# Patient Record
Sex: Female | Born: 1995 | Race: Black or African American | Hispanic: No | Marital: Single | Smoking: Never smoker
Health system: Southern US, Community
[De-identification: ages and names within clinical notes are randomized; demographics above are authoritative.]

## PROBLEM LIST (undated history)

## (undated) DIAGNOSIS — D649 Anemia, unspecified: Secondary | ICD-10-CM

## (undated) DIAGNOSIS — F32A Depression, unspecified: Secondary | ICD-10-CM

## (undated) DIAGNOSIS — J45909 Unspecified asthma, uncomplicated: Secondary | ICD-10-CM

## (undated) DIAGNOSIS — F419 Anxiety disorder, unspecified: Secondary | ICD-10-CM

## (undated) HISTORY — DX: Depression, unspecified: F32.A

## (undated) HISTORY — DX: Anxiety disorder, unspecified: F41.9

---

## 2020-06-04 ENCOUNTER — Encounter (HOSPITAL_COMMUNITY): Payer: Self-pay | Admitting: Emergency Medicine

## 2020-06-04 ENCOUNTER — Other Ambulatory Visit: Payer: Self-pay

## 2020-06-04 ENCOUNTER — Observation Stay (HOSPITAL_COMMUNITY)
Admission: EM | Admit: 2020-06-04 | Discharge: 2020-06-05 | Disposition: A | Discharge status: Home / Self Care | Payer: Self-pay | Attending: Internal Medicine | Admitting: Internal Medicine

## 2020-06-04 ENCOUNTER — Emergency Department (HOSPITAL_COMMUNITY): Payer: Self-pay

## 2020-06-04 DIAGNOSIS — N760 Acute vaginitis: Secondary | ICD-10-CM | POA: Insufficient documentation

## 2020-06-04 DIAGNOSIS — D649 Anemia, unspecified: Principal | ICD-10-CM | POA: Diagnosis present

## 2020-06-04 DIAGNOSIS — Z20822 Contact with and (suspected) exposure to covid-19: Secondary | ICD-10-CM | POA: Insufficient documentation

## 2020-06-04 DIAGNOSIS — N920 Excessive and frequent menstruation with regular cycle: Secondary | ICD-10-CM | POA: Insufficient documentation

## 2020-06-04 DIAGNOSIS — J45909 Unspecified asthma, uncomplicated: Secondary | ICD-10-CM | POA: Insufficient documentation

## 2020-06-04 DIAGNOSIS — Z8616 Personal history of COVID-19: Secondary | ICD-10-CM | POA: Insufficient documentation

## 2020-06-04 DIAGNOSIS — R109 Unspecified abdominal pain: Secondary | ICD-10-CM | POA: Insufficient documentation

## 2020-06-04 HISTORY — DX: Anemia, unspecified: D64.9

## 2020-06-04 HISTORY — DX: Unspecified asthma, uncomplicated: J45.909

## 2020-06-04 LAB — URINALYSIS, ROUTINE W REFLEX MICROSCOPIC
Bilirubin Urine: NEGATIVE
Glucose, UA: NEGATIVE mg/dL
Ketones, ur: 20 mg/dL — AB
Nitrite: NEGATIVE
Protein, ur: 30 mg/dL — AB
RBC / HPF: >50 RBC/hpf — ABNORMAL HIGH (ref 0–5)
Specific Gravity, Urine: 1.021 (ref 1.005–1.030)
pH: 6 (ref 5.0–8.0)

## 2020-06-04 LAB — COMPREHENSIVE METABOLIC PANEL
ALT: 12 U/L (ref 0–44)
AST: 20 U/L (ref 15–41)
Albumin: 4.7 g/dL (ref 3.5–5.0)
Alkaline Phosphatase: 40 U/L (ref 38–126)
Anion gap: 8 (ref 5–15)
BUN: 6 mg/dL (ref 6–20)
CO2: 22 mmol/L (ref 22–32)
Calcium: 9 mg/dL (ref 8.9–10.3)
Chloride: 107 mmol/L (ref 98–111)
Creatinine, Ser: 0.64 mg/dL (ref 0.44–1.00)
GFR, Estimated: >60 mL/min (ref >60)
Glucose, Bld: 97 mg/dL (ref 70–99)
Potassium: 3.3 mmol/L — ABNORMAL LOW (ref 3.5–5.1)
Sodium: 137 mmol/L (ref 135–145)
Total Bilirubin: 0.8 mg/dL (ref 0.3–1.2)
Total Protein: 7.6 g/dL (ref 6.5–8.1)

## 2020-06-04 LAB — CBC
HCT: 20.5 % — ABNORMAL LOW (ref 36.0–46.0)
Hemoglobin: 4.9 g/dL — CL (ref 12.0–15.0)
MCH: 14.6 pg — ABNORMAL LOW (ref 26.0–34.0)
MCHC: 23.9 g/dL — ABNORMAL LOW (ref 30.0–36.0)
MCV: 61 fL — ABNORMAL LOW (ref 80.0–100.0)
Platelets: 170 10*3/uL (ref 150–400)
RBC: 3.36 MIL/uL — ABNORMAL LOW (ref 3.87–5.11)
RDW: 23.6 % — ABNORMAL HIGH (ref 11.5–15.5)
WBC: 7.6 10*3/uL (ref 4.0–10.5)
nRBC: 0 % (ref 0.0–0.2)

## 2020-06-04 LAB — RESP PANEL BY RT-PCR (FLU A&B, COVID) ARPGX2
Influenza A by PCR: NEGATIVE
Influenza B by PCR: NEGATIVE
SARS Coronavirus 2 by RT PCR: NEGATIVE

## 2020-06-04 LAB — I-STAT BETA HCG BLOOD, ED (MC, WL, AP ONLY): I-stat hCG, quantitative: <5 m[IU]/mL (ref <5)

## 2020-06-04 LAB — HEMOGLOBIN AND HEMATOCRIT, BLOOD
HCT: 19.6 % — ABNORMAL LOW (ref 36.0–46.0)
Hemoglobin: 4.6 g/dL — CL (ref 12.0–15.0)

## 2020-06-04 LAB — LIPASE, BLOOD: Lipase: 31 U/L (ref 11–51)

## 2020-06-04 LAB — PREPARE RBC (CROSSMATCH)

## 2020-06-04 LAB — ABO/RH: ABO/RH(D): O POS

## 2020-06-04 MED ORDER — METRONIDAZOLE IN NACL 5-0.79 MG/ML-% IV SOLN
500.0000 mg | Freq: Three times a day (TID) | INTRAVENOUS | Status: DC
  Administered 2020-06-05 (×2): 500 mg via INTRAVENOUS
  Filled 2020-06-04 (×2): qty 100

## 2020-06-04 MED ORDER — ACETAMINOPHEN 650 MG RE SUPP: 650.0000 mg | Freq: Four times a day (QID) | RECTAL | Status: DC | PRN

## 2020-06-04 MED ORDER — ACETAMINOPHEN 325 MG PO TABS: 650.0000 mg | ORAL_TABLET | Freq: Four times a day (QID) | ORAL | Status: DC | PRN

## 2020-06-04 MED ORDER — IOHEXOL 300 MG/ML  SOLN
100.0000 mL | Freq: Once | INTRAMUSCULAR | Status: AC | PRN
  Administered 2020-06-04: 100 mL via INTRAVENOUS

## 2020-06-04 MED ORDER — MORPHINE SULFATE (PF) 4 MG/ML IV SOLN
4.0000 mg | Freq: Once | INTRAVENOUS | Status: AC
  Administered 2020-06-04: 4 mg via INTRAVENOUS
  Filled 2020-06-04: qty 1

## 2020-06-04 MED ORDER — SODIUM CHLORIDE 0.9 % IV BOLUS
500.0000 mL | Freq: Once | INTRAVENOUS | Status: AC
  Administered 2020-06-04: 500 mL via INTRAVENOUS

## 2020-06-04 MED ORDER — SODIUM CHLORIDE 0.9% IV SOLUTION: Freq: Once | INTRAVENOUS | Status: AC

## 2020-06-04 MED ORDER — ONDANSETRON HCL 4 MG/2ML IJ SOLN
4.0000 mg | Freq: Four times a day (QID) | INTRAMUSCULAR | Status: DC | PRN
  Administered 2020-06-05: 4 mg via INTRAVENOUS
  Filled 2020-06-04: qty 2

## 2020-06-04 MED ORDER — SODIUM CHLORIDE 0.9 % IV BOLUS
1000.0000 mL | Freq: Once | INTRAVENOUS | Status: AC
  Administered 2020-06-04: 1000 mL via INTRAVENOUS

## 2020-06-04 MED ORDER — MEGESTROL ACETATE 40 MG PO TABS
40.0000 mg | ORAL_TABLET | ORAL | Status: DC
  Filled 2020-06-04: qty 1

## 2020-06-04 NOTE — ED Provider Notes (Addendum)
[facility]-EMERGENCY DEPT Provider Note   CSN: [id number] Arrival date & time: 06/04/20  1947     History Chief Complaint  Patient presents with  . Emesis    Michele Castaneda is a 25 y.o. female reports is otherwise healthy no daily medication use.  Patient arrives today for evaluation abdominal pain nausea and vomiting.  Patient reports yesterday she began to have lower abdominal cramping this was initially mild constant nonradiating no clear aggravating alleviating factors and she started her period this is the normal time of month for her to start.  She reports her period was normal yesterday but today her cramping worsened.  She reports she has had continued cramping of her lower abdomen but now feels that she has developed upper abdominal pain as well this has been a severe cramping sensation constant no clear aggravating or alleviating factors she feels it is separate from her menstrual cycle pain.  Patient reports that when pain is most severe she had 2 episodes of nonbloody/nonbilious emesis today.  Denies fever/chills, chest pain/shortness of breath, cough, dysuria/hematuria, vaginal discharge, concern for STI or any additional concerns.  Of note patient reports she was seen by her OB/GYN earlier this week and was started on Flagyl for BV.  HPI     History reviewed. No pertinent past medical history.  Patient Active Problem List   Diagnosis Date Noted  . Symptomatic anemia 06/04/2020  . Menorrhagia 06/04/2020    History reviewed. No pertinent surgical history.   OB History   No obstetric history on file.     History reviewed. No pertinent family history.     Home Medications Prior to Admission medications   Not on File    Allergies    Patient has no known allergies.  Review of Systems   Review of Systems Ten systems are reviewed and are negative for acute change except as noted in the HPI  Physical Exam Updated Vital Signs BP 113/63    Pulse 60   Temp 98.5 F (36.9 C) (Oral)   Resp 19   LMP 06/03/2020 (Exact Date)   SpO2 92%   Physical Exam Constitutional:      General: She is not in acute distress.    Appearance: Normal appearance. She is well-developed. She is not ill-appearing or diaphoretic.  HENT:     Head: Normocephalic and atraumatic.  Eyes:     General: Vision grossly intact. Gaze aligned appropriately.     Pupils: Pupils are equal, round, and reactive to light.  Neck:     Trachea: Trachea and phonation normal.  Pulmonary:     Effort: Pulmonary effort is normal. No respiratory distress.  Abdominal:     General: There is no distension.     Palpations: Abdomen is soft.     Tenderness: There is generalized abdominal tenderness. There is no guarding or rebound.  Genitourinary:    Comments: Refused by patient. Musculoskeletal:        General: Normal range of motion.     Cervical back: Normal range of motion.  Skin:    General: Skin is warm and dry.  Neurological:     Mental Status: She is alert.     GCS: GCS eye subscore is 4. GCS verbal subscore is 5. GCS motor subscore is 6.     Comments: Speech is clear and goal oriented, follows commands Major Cranial nerves without deficit, no facial droop Moves extremities without ataxia, coordination intact  Psychiatric:  Behavior: Behavior normal.     ED Results / Procedures / Treatments   Labs (all labs ordered are listed, but only abnormal results are displayed) Labs Reviewed  COMPREHENSIVE METABOLIC PANEL - Abnormal; Notable for the following components:      Result Value   Potassium 3.3 (*)    All other components within normal limits  CBC - Abnormal; Notable for the following components:   RBC 3.36 (*)    Hemoglobin 4.9 (*)    HCT 20.5 (*)    MCV 61.0 (*)    MCH 14.6 (*)    MCHC 23.9 (*)    RDW 23.6 (*)    All other components within normal limits  URINALYSIS, ROUTINE W REFLEX MICROSCOPIC - Abnormal; Notable for the following  components:   Color, Urine AMBER (*)    Hgb urine dipstick MODERATE (*)    Ketones, ur 20 (*)    Protein, ur 30 (*)    Leukocytes,Ua SMALL (*)    RBC / HPF >50 (*)    Bacteria, UA RARE (*)    All other components within normal limits  HEMOGLOBIN AND HEMATOCRIT, BLOOD - Abnormal; Notable for the following components:   Hemoglobin 4.6 (*)    HCT 19.6 (*)    All other components within normal limits  RESP PANEL BY RT-PCR (FLU A&B, COVID) ARPGX2  LIPASE, BLOOD  I-STAT BETA HCG BLOOD, ED ([facility], [facility], [facility] ONLY)  TYPE AND SCREEN  PREPARE RBC (CROSSMATCH)  ABO/RH    EKG None  Radiology CT ABDOMEN PELVIS W CONTRAST  Result Date: 06/04/2020 CLINICAL DATA:  Vomiting and weakness for 2 days EXAM: CT ABDOMEN AND PELVIS WITH CONTRAST TECHNIQUE: Multidetector CT imaging of the abdomen and pelvis was performed using the standard protocol following bolus administration of intravenous contrast. CONTRAST:  100mL OMNIPAQUE IOHEXOL 300 MG/ML  SOLN COMPARISON:  None. FINDINGS: Lower chest: No acute abnormality. Hepatobiliary: No focal liver abnormality is seen. No gallstones, gallbladder wall thickening, or biliary dilatation. Pancreas: Unremarkable. No pancreatic ductal dilatation or surrounding inflammatory changes. Spleen: Normal in size without focal abnormality. Adrenals/Urinary Tract: Adrenal glands are within normal limits. Kidneys demonstrate a normal enhancement pattern bilaterally. No renal calculi or obstructive changes are seen. Bladder is partially distended and within normal limits. Stomach/Bowel: The appendix is within normal limits. No inflammatory changes are seen. No obstructive or inflammatory changes of the colon or small bowel are noted. The stomach is decompressed. Vascular/Lymphatic: No significant vascular findings are present. No enlarged abdominal or pelvic lymph nodes. Reproductive: Uterus is within normal limits. Fluid is noted within the endometrial canal likely related patient's  menstrual status. Some simple cysts are noted within the right ovary. The largest of these measures 2.6 cm in greatest dimension. Other: Minimal free fluid is noted likely physiologic in nature. Musculoskeletal: No acute or significant osseous findings. IMPRESSION: No renal calculi or obstructive changes. Simple appearing cyst within the right ovary measuring 2.6 cm. No follow-up imaging recommended. Note: This recommendation does not apply to premenarchal patients and to those with increased risk (genetic, family history, elevated tumor markers or other high-risk factors) of ovarian cancer. Reference: JACR 2020 Feb; 17(2):248-254 Electronically Signed   By: Alcide CleverMark  Lukens M.D.   On: 06/04/2020 22:54    Procedures .Critical Care Performed by: Bill SalinasMorelli, Arneshia Ade A, PA-C Authorized by: Bill SalinasMorelli, Tonjua Rossetti A, PA-C   Critical care provider statement:    Critical care time (minutes):  35   Critical care was necessary to treat or prevent imminent  or life-threatening deterioration of the following conditions: Anemia.   Critical care was time spent personally by me on the following activities:  Discussions with consultants, evaluation of patient's response to treatment, examination of patient, ordering and performing treatments and interventions, ordering and review of laboratory studies, ordering and review of radiographic studies, pulse oximetry, re-evaluation of patient's condition, obtaining history from patient or surrogate, review of old charts and development of treatment plan with patient or surrogate     Medications Ordered in ED Medications  megestrol (MEGACE) tablet 40 mg (has no administration in time range)  sodium chloride 0.9 % bolus 1,000 mL (0 mLs Intravenous Stopped 06/04/20 2139)  morphine 4 MG/ML injection 4 mg (4 mg Intravenous Given 06/04/20 2201)  sodium chloride 0.9 % bolus 500 mL (0 mLs Intravenous Stopped 06/04/20 2251)  0.9 %  sodium chloride infusion (Manually program via Guardrails IV  Fluids) ( Intravenous New Bag/Given 06/04/20 2258)  iohexol (OMNIPAQUE) 300 MG/ML solution 100 mL (100 mLs Intravenous Contrast Given 06/04/20 2230)    ED Course  I have reviewed the triage vital signs and the nursing notes.  Pertinent labs & imaging results that were available during my care of the patient were reviewed by me and considered in my medical decision making (see chart for details).  Clinical Course as of 06/04/20 2331  Wynelle Link Jun 04, 2020  2305 Pelvic US to look for fibroids [BM]  2317 Megace 40mg  bid 7 day [BM]  2317 Dr. [BM]  2320 Check for von wilebrands? [BM]    Clinical Course User Index [BM] 2321   MDM Rules/Calculators/A&P                         Additional history obtained from: 1. Nursing notes from this visit. 2. No prior medical records available. ------------------- 25 year old female presented for abdominal pain nausea and vomiting.  She started her menstrual cycle yesterday normal amount of vaginal bleeding and lower pelvic cramping yesterday.  Pain changed today more generalized more severe and associated nausea and vomiting which is abnormal for her.  She is diffusely tender on exam.  Abdominal pain labs ordered in triage.  Patient denies any concern for STD and deferred pelvic examination.  Plan of care is to obtain CT abdomen pelvis for further evaluation.  Possibly in the patient's pain today may be due to other intra-abdominal pathology rather than her menstrual cycle such as appendicitis.  Shared decision making made with patient and risks versus benefits of CT imaging were reviewed and patient elects to proceed with CT imaging.  Patient's mother at bedside agrees with plan of care. ----------------- Informed by nursing staff patient's hemoglobin is 4.9.  I reevaluated the patient she reports that she has a history of anemia and does not take her iron supplementations but has not had her hemoglobin rechecked in several years and does not  know what her baseline is.  I have no records on file to see what patient's baseline hemoglobin is.  Patient reports that she has been feeling weak over the past few days denies any chest pain or shortness of breath.  Patient will need blood transfusion for severe anemia, risk versus benefits of blood transfusion were discussed with patient and her mother and they elect to proceed with 2 units RBC transfusion.  Patient reports that she is having her normal amount of vaginal bleeding she normally goes through around 6-7 pads a day and her cycle  recently last 5 days.  Her last menstrual cycle was 1 month ago.  She denies any hematemesis, melena, hematochezia or other possible sources of bleeding - I ordered, reviewed and interpreted labs which include: Pregnancy test negative. CMP shows no emergent electrolyte derangement, AKI, LFT elevations or gap. CBC shows hemoglobin 4.9, no leukocytosis. Lipase within normal limits. Urinalysis shows hemoglobin, WBCs, rare bacteria, nitrate negative suspect these are due to contaminated catch and patient's menses.  CTAP  IMPRESSION:  No renal calculi or obstructive changes.    Simple appearing cyst within the right ovary measuring 2.6 cm. No  follow-up imaging recommended. Note: This recommendation does not  apply to premenarchal patients and to those with increased risk  (genetic, family history, elevated tumor markers or other high-risk  factors) of ovarian cancer. Reference: JACR 2020 Feb; 17(2):248-254  -  11:10 PM: Consult with Dr. Toniann Fail, asked that ER consult OB/GYN prior to admission. - 11:17 PM: Consult with OB/GYN Dr. Donavan Foil; recommends Megace 40 mg twice daily x7 days.  Recommends pelvic ultrasound to look for fibroids.  Recommends checking for von Willebrand's due to severe anemia.  Recommends outpatient OB/GYN follow-up after admission. - 11:25 PM: Consult with Dr. Toniann Fail, patient accepted to hospitalist service.  Patient reassessed she  is resting comfortably no acute distress mother at bedside vital signs are stable.  They state understanding of care plan and are agreeable for admission.  Case was discussed with Dr. Effie Shy during this visit.  Note: Portions of this report may have been transcribed using voice recognition software. Every effort was made to ensure accuracy; however, inadvertent computerized transcription errors may still be present. Final Clinical Impression(s) / ED Diagnoses Final diagnoses:  Symptomatic anemia  Abdominal pain, unspecified abdominal location    Rx / DC Orders ED Discharge Orders    None       Elizabeth Palau 06/04/20 2332    Mancel Bale, MD 06/05/20 1605    Bill Salinas, PA-C 06/07/20 1016    Mancel Bale, MD 06/12/20 [phone]

## 2020-06-04 NOTE — H&P (Signed)
History and Physical    Michele Castaneda EPP:[id number] DOB: 02-Aug-1995 DOA: 06/04/2020  PCP: Pcp, No  Patient coming from: Home.  Chief Complaint: Abdominal pain.  HPI: Michele Castaneda is a 25 y.o. female with history of anemia from menorrhagia presents to the ER with complaints of abdominal pain since this morning.  Pain is diffuse all over the abdomen.  Had 2 or 3 episodes of vomiting which was nonbloody.  Patient has known history of anemia from menorrhagia follows up with OB/GYN at [facility] [place].  Patient states she had Covid infection last month and only had mild symptoms.  Patient also was recently started about 5 days ago on Flagyl for bacterial vaginosis by patient's OB/GYN.  ED Course: In the ER CT abdomen pelvis was done with contrast which are largely unremarkable.  Patient's lab work showed hemoglobin of 4.9.  Patient just had menstrual cycle started yesterday.  ER physician discussed with on-call OB/GYN who advised starting patient on Megace 40 mg p.o. twice daily.  Patient also has been started on 2 units of PRBC transfusion for severe anemia.  Admitted for further observation.  Review of Systems: As per HPI, rest all negative.   Past Medical History:  Diagnosis Date  . Anemia   . Asthma     History reviewed. No pertinent surgical history.   reports that she has never smoked. She has never used smokeless tobacco. No history on file for alcohol use and drug use.  No Known Allergies  Family History  Problem Relation Age of Onset  . Colon cancer Neg Hx     Prior to Admission medications   Not on File    Physical Exam: Constitutional: Moderately built and nourished. Vitals:   06/04/20 2015 06/04/20 2100 06/04/20 2200 06/04/20 2300  BP: 109/72 101/61 111/67 113/63  Pulse: 100 95 71 60  Resp: 15 16 15 19   Temp:      TempSrc:      SpO2: 100% 100% 100% 92%   Eyes: Anicteric mild pallor. ENMT: No discharge from the ears eyes nose or mouth. Neck:  No mass felt.  No neck rigidity. Respiratory: No rhonchi or crepitations. Cardiovascular: S1-S2 heard. Abdomen: Soft nontender bowel sounds present. Musculoskeletal: No edema. Skin: No rash. Neurologic: Alert awake oriented to time place and person.  Moves all extremities. Psychiatric: Appears normal.  Normal affect.   Labs on Admission: I have personally reviewed following labs and imaging studies  CBC: Recent Labs  Lab 06/04/20 2018 06/04/20 2151  WBC 7.6  --   HGB 4.9* 4.6*  HCT 20.5* 19.6*  MCV 61.0*  --   PLT 170  --    Basic Metabolic Panel: Recent Labs  Lab 06/04/20 2018  NA 137  K 3.3*  CL 107  CO2 22  GLUCOSE 97  BUN 6  CREATININE 0.64  CALCIUM 9.0   GFR: CrCl cannot be calculated (Unknown ideal weight.). Liver Function Tests: Recent Labs  Lab 06/04/20 2018  AST 20  ALT 12  ALKPHOS 40  BILITOT 0.8  PROT 7.6  ALBUMIN 4.7   Recent Labs  Lab 06/04/20 2018  LIPASE 31   No results for input(s): AMMONIA in the last 168 hours. Coagulation Profile: No results for input(s): INR, PROTIME in the last 168 hours. Cardiac Enzymes: No results for input(s): CKTOTAL, CKMB, CKMBINDEX, TROPONINI in the last 168 hours. BNP (last 3 results) No results for input(s): PROBNP in the last 8760 hours. HbA1C: No results for input(s):  HGBA1C in the last 72 hours. CBG: No results for input(s): GLUCAP in the last 168 hours. Lipid Profile: No results for input(s): CHOL, HDL, LDLCALC, TRIG, CHOLHDL, LDLDIRECT in the last 72 hours. Thyroid Function Tests: No results for input(s): TSH, T4TOTAL, FREET4, T3FREE, THYROIDAB in the last 72 hours. Anemia Panel: No results for input(s): VITAMINB12, FOLATE, FERRITIN, TIBC, IRON, RETICCTPCT in the last 72 hours. Urine analysis:    Component Value Date/Time   COLORURINE AMBER (A) 06/04/2020 2000   APPEARANCEUR CLEAR 06/04/2020 2000   LABSPEC 1.021 06/04/2020 2000   PHURINE 6.0 06/04/2020 2000   GLUCOSEU NEGATIVE 06/04/2020  2000   HGBUR MODERATE (A) 06/04/2020 2000   BILIRUBINUR NEGATIVE 06/04/2020 2000   KETONESUR 20 (A) 06/04/2020 2000   PROTEINUR 30 (A) 06/04/2020 2000   NITRITE NEGATIVE 06/04/2020 2000   LEUKOCYTESUR SMALL (A) 06/04/2020 2000   Sepsis Labs: @LABRCNTIP (procalcitonin:4,lacticidven:4) )No results found for this or any previous visit (from the past 240 hour(s)).   Radiological Exams on Admission: CT ABDOMEN PELVIS W CONTRAST  Result Date: 06/04/2020 CLINICAL DATA:  Vomiting and weakness for 2 days EXAM: CT ABDOMEN AND PELVIS WITH CONTRAST TECHNIQUE: Multidetector CT imaging of the abdomen and pelvis was performed using the standard protocol following bolus administration of intravenous contrast. CONTRAST:  08/04/2020 OMNIPAQUE IOHEXOL 300 MG/ML  SOLN COMPARISON:  None. FINDINGS: Lower chest: No acute abnormality. Hepatobiliary: No focal liver abnormality is seen. No gallstones, gallbladder wall thickening, or biliary dilatation. Pancreas: Unremarkable. No pancreatic ductal dilatation or surrounding inflammatory changes. Spleen: Normal in size without focal abnormality. Adrenals/Urinary Tract: Adrenal glands are within normal limits. Kidneys demonstrate a normal enhancement pattern bilaterally. No renal calculi or obstructive changes are seen. Bladder is partially distended and within normal limits. Stomach/Bowel: The appendix is within normal limits. No inflammatory changes are seen. No obstructive or inflammatory changes of the colon or small bowel are noted. The stomach is decompressed. Vascular/Lymphatic: No significant vascular findings are present. No enlarged abdominal or pelvic lymph nodes. Reproductive: Uterus is within normal limits. Fluid is noted within the endometrial canal likely related patient's menstrual status. Some simple cysts are noted within the right ovary. The largest of these measures 2.6 cm in greatest dimension. Other: Minimal free fluid is noted likely physiologic in nature.  Musculoskeletal: No acute or significant osseous findings. IMPRESSION: No renal calculi or obstructive changes. Simple appearing cyst within the right ovary measuring 2.6 cm. No follow-up imaging recommended. Note: This recommendation does not apply to premenarchal patients and to those with increased risk (genetic, family history, elevated tumor markers or other high-risk factors) of ovarian cancer. Reference: JACR 2020 Feb; 17(2):248-254 Electronically Signed   By: [birth year]1972 M.D.   On: 06/04/2020 22:54     Assessment/Plan Principal Problem:   Symptomatic anemia Active Problems:   Menorrhagia    1. Severe anemia -likely from menorrhagia.  2 units of PRBC transfusion has been ordered patient will need to be on iron tablets. 2. Menorrhagia -ER physician discussed with on-call OB/GYN who advised patient to be started on Megace 40 mg p.o. twice daily for 7 days get pelvic ultrasound and follow-up with patient's OB/GYN at [facility] in [place] which patient follows up.  Also to check for any bleeding disorders.  Patient refused pelvic ultrasound and had a transabdominal pelvic ultrasound. 3. Recently treated for bacterial vaginosis for which patient has taken 5 days of Flagyl 2 more days remaining.  Ordered IV. 4. Abdominal pain cause not clear.  CT abdomen  pelvis was unremarkable.  We will continue to monitor.  Follow transabdominal pelvic ultrasound. 5. Patient recently had Covid infection last month.   DVT prophylaxis: SCDs.  Avoiding anticoagulation due to severe anemia. Code Status: Full code. Family Communication: Patient's mother at the bedside. Disposition Plan: Home. Consults called: ER physician discussed with on-call OB/GYN. Admission status: Observation.   Eduard Clos MD [facility] Pager [phone].  If 7PM-7AM, please contact night-coverage [email or web address] Password [facility]  06/04/2020, 11:37 PM

## 2020-06-04 NOTE — ED Triage Notes (Signed)
Pt reports vomiting, weakness, and crampy abdominal pain for 2 days. Reports 2 episodes of vomiting today. Denies sick contacts. No fever.

## 2020-06-05 DIAGNOSIS — R109 Unspecified abdominal pain: Secondary | ICD-10-CM

## 2020-06-05 DIAGNOSIS — N920 Excessive and frequent menstruation with regular cycle: Secondary | ICD-10-CM

## 2020-06-05 DIAGNOSIS — D649 Anemia, unspecified: Secondary | ICD-10-CM

## 2020-06-05 LAB — CBC WITH DIFFERENTIAL/PLATELET
Abs Immature Granulocytes: 0.06 10*3/uL (ref 0.00–0.07)
Basophils Absolute: 0.1 10*3/uL (ref 0.0–0.1)
Basophils Relative: 1 %
Eosinophils Absolute: 0.1 10*3/uL (ref 0.0–0.5)
Eosinophils Relative: 1 %
HCT: 28.7 % — ABNORMAL LOW (ref 36.0–46.0)
Hemoglobin: 7.7 g/dL — ABNORMAL LOW (ref 12.0–15.0)
Immature Granulocytes: 1 %
Lymphocytes Relative: 24 %
Lymphs Abs: 1.8 10*3/uL (ref 0.7–4.0)
MCH: 18.8 pg — ABNORMAL LOW (ref 26.0–34.0)
MCHC: 26.8 g/dL — ABNORMAL LOW (ref 30.0–36.0)
MCV: 70 fL — ABNORMAL LOW (ref 80.0–100.0)
Monocytes Absolute: 0.7 10*3/uL (ref 0.1–1.0)
Monocytes Relative: 9 %
Neutro Abs: 4.9 10*3/uL (ref 1.7–7.7)
Neutrophils Relative %: 64 %
Platelets: 143 10*3/uL — ABNORMAL LOW (ref 150–400)
RBC: 4.1 MIL/uL (ref 3.87–5.11)
RDW: 27.6 % — ABNORMAL HIGH (ref 11.5–15.5)
WBC: 7.6 10*3/uL (ref 4.0–10.5)
nRBC: 0.4 % — ABNORMAL HIGH (ref 0.0–0.2)

## 2020-06-05 LAB — BASIC METABOLIC PANEL
Anion gap: 11 (ref 5–15)
BUN: <5 mg/dL — ABNORMAL LOW (ref 6–20)
CO2: 19 mmol/L — ABNORMAL LOW (ref 22–32)
Calcium: 8.4 mg/dL — ABNORMAL LOW (ref 8.9–10.3)
Chloride: 110 mmol/L (ref 98–111)
Creatinine, Ser: 0.53 mg/dL (ref 0.44–1.00)
GFR, Estimated: >60 mL/min (ref >60)
Glucose, Bld: 83 mg/dL (ref 70–99)
Potassium: 3.3 mmol/L — ABNORMAL LOW (ref 3.5–5.1)
Sodium: 140 mmol/L (ref 135–145)

## 2020-06-05 LAB — HEMOGLOBIN AND HEMATOCRIT, BLOOD
HCT: 27.9 % — ABNORMAL LOW (ref 36.0–46.0)
Hemoglobin: 7.8 g/dL — ABNORMAL LOW (ref 12.0–15.0)

## 2020-06-05 LAB — MAGNESIUM: Magnesium: 2 mg/dL (ref 1.7–2.4)

## 2020-06-05 LAB — HIV ANTIBODY (ROUTINE TESTING W REFLEX): HIV Screen 4th Generation wRfx: NONREACTIVE

## 2020-06-05 MED ORDER — MEGESTROL ACETATE 40 MG PO TABS
40.0000 mg | ORAL_TABLET | Freq: Two times a day (BID) | ORAL | Status: DC
  Administered 2020-06-05: 40 mg via ORAL
  Filled 2020-06-05 (×2): qty 1

## 2020-06-05 MED ORDER — SENNOSIDES-DOCUSATE SODIUM 8.6-50 MG PO TABS: 1.0000 | ORAL_TABLET | Freq: Two times a day (BID) | ORAL | Status: AC

## 2020-06-05 MED ORDER — SENNOSIDES-DOCUSATE SODIUM 8.6-50 MG PO TABS
1.0000 | ORAL_TABLET | Freq: Two times a day (BID) | ORAL | Status: DC
  Administered 2020-06-05: 1 via ORAL
  Filled 2020-06-05: qty 1

## 2020-06-05 MED ORDER — OXYCODONE-ACETAMINOPHEN 5-325 MG PO TABS: 1.0000 | ORAL_TABLET | ORAL | Status: DC | PRN

## 2020-06-05 MED ORDER — POTASSIUM CHLORIDE CRYS ER 20 MEQ PO TBCR
40.0000 meq | EXTENDED_RELEASE_TABLET | Freq: Once | ORAL | Status: AC
  Administered 2020-06-05: 40 meq via ORAL
  Filled 2020-06-05: qty 2

## 2020-06-05 MED ORDER — POLYSACCHARIDE IRON COMPLEX 150 MG PO CAPS
150.0000 mg | ORAL_CAPSULE | Freq: Every day | ORAL | Status: DC
  Administered 2020-06-05: 150 mg via ORAL
  Filled 2020-06-05: qty 1

## 2020-06-05 MED ORDER — POLYSACCHARIDE IRON COMPLEX 150 MG PO CAPS: 150.0000 mg | ORAL_CAPSULE | Freq: Every day | ORAL | 1 refills | Status: DC

## 2020-06-05 MED ORDER — MEGESTROL ACETATE 40 MG PO TABS: 40.0000 mg | ORAL_TABLET | Freq: Two times a day (BID) | ORAL | 0 refills | Status: AC

## 2020-06-05 MED ORDER — PROMETHAZINE HCL 25 MG/ML IJ SOLN
12.5000 mg | Freq: Once | INTRAMUSCULAR | Status: AC
  Administered 2020-06-05: 12.5 mg via INTRAVENOUS
  Filled 2020-06-05: qty 1

## 2020-06-05 NOTE — Discharge Summary (Signed)
Physician Discharge Summary  Michele Castaneda VQM:[id number] DOB: May 28, 1995 DOA: 06/04/2020  PCP: Pcp, No  Admit date: 06/04/2020 Discharge date: 06/05/2020  Time spent: 45 minutes  Recommendations for Outpatient Follow-up:  1. Follow-up with PCP repeat in 2 weeks.  On follow-up patient will need a CBC done to follow-up on H&H. 2. Follow-up with OB/GYN in 1 to 2 weeks for follow-up on menorrhagia.   Discharge Diagnoses:  Principal Problem:   Symptomatic anemia Active Problems:   Menorrhagia   Anemia   Discharge Condition: Stable and improved  Diet recommendation: Regular  Filed Weights   06/05/20 0031  Weight: 57.2 kg    History of present illness:  HPI per Dr. Nolon Castaneda is a 25 y.o. female with history of anemia from menorrhagia presents to the ER with complaints of abdominal pain since this morning.  Pain is diffuse all over the abdomen.  Had 2 or 3 episodes of vomiting which was nonbloody.  Patient has known history of anemia from menorrhagia follows up with OB/GYN at [facility] [place].  Patient stated she had Covid infection last month and only had mild symptoms.  Patient also was recently started about 5 days ago on Flagyl for bacterial vaginosis by patient's OB/GYN.  ED Course: In the ER CT abdomen pelvis was done with contrast which are largely unremarkable.  Patient's lab work showed hemoglobin of 4.9.  Patient just had menstrual cycle started yesterday.  ER physician discussed with on-call OB/GYN who advised starting patient on Megace 40 mg p.o. twice daily.  Patient also has been started on 2 units of PRBC transfusion for severe anemia.  Admitted for further observation.  Hospital Course:  1 severe symptomatic microcytic anemia likely secondary to menorrhagia Patient had presented with complaints of abdominal pain diffusely, 2-3 episodes of emesis which was nonbloody, recently started her menses.  Patient noted to have a known history of anemia  from menorrhagia follows up with OB/GYN.  On admission hemoglobin noted to be 4.9.  Patient transfused 2 units packed red blood cells with posttransfusion hemoglobin at 7.7.  Patient with clinical improvement.  Repeat H&H done was 7.8. Patient was discharged home in stable and improved condition on Nu-Iron 150 mg daily.  Outpatient follow-up with PCP and OB/GYN.  2.  Menorrhagia ED physician discussed case with on-call OB/GYN who recommended patient be started on Megace 40 mg twice daily x7 days as well as evaluation with pelvic ultrasound and outpatient follow-up with OB/GYN.  Pelvic ultrasound with Dopplers which was done with 2.2 cm simple right ovarian cyst.  Nonvisualization of left ovary.  No other adnexal mass or free fluid.  No evidence of ovarian torsion.  Normal sonographic appearance of uterus and endometrium.  Outpatient follow-up with OB/GYN.  3.  Recently treated bacterial vaginosis Patient noted to have taken 5 days of Flagyl prior to admission.  Patient placed on IV Flagyl during the hospitalization.  Completion of Flagyl in the outpatient setting.  Patient likely will require 1 more day.  Outpatient follow-up.  4.  Abdominal pain Likely secondary to problem #2.  CT abdomen and pelvis unremarkable.  Pelvic ultrasound done with 2.2 cm simple right ovarian cyst.  No other acute abnormalities.  Clinical improvement.  Outpatient follow-up.  5.  Status post recent Covid infection February 2022 Patient remained asymptomatic.  Procedures:  CT abdomen and pelvis 06/04/2020  Pelvic ultrasound 06/04/2020  Transfused 2 units packed red blood cells 06/05/2020  Consultations:  None  Discharge Exam: Vitals:  06/05/20 1404 06/05/20 1546  BP: (!) 89/46 101/69  Pulse: 65 65  Resp: 16   Temp: 98.3 F (36.8 C)   SpO2:      General: NAD Cardiovascular: RRR Respiratory: CTAB  Discharge Instructions   Discharge Instructions    Diet general   Complete by: As directed    Increase  activity slowly   Complete by: As directed      Allergies as of 06/05/2020   No Known Allergies     Medication List    TAKE these medications   acetaminophen 500 MG tablet Commonly known as: TYLENOL Take 500 mg by mouth every 6 (six) hours as needed for mild pain, fever or headache.   fluconazole 150 MG tablet Commonly known as: DIFLUCAN Take 150 mg by mouth once.   iron polysaccharides 150 MG capsule Commonly known as: NIFEREX Take 1 capsule (150 mg total) by mouth daily. Start taking on: June 06, 2020   megestrol 40 MG tablet Commonly known as: MEGACE Take 1 tablet (40 mg total) by mouth 2 (two) times daily for 6 days.   metroNIDAZOLE 500 MG tablet Commonly known as: FLAGYL Take 500 mg by mouth every 8 (eight) hours. 7 day supply   senna-docusate 8.6-50 MG tablet Commonly known as: Senokot-S Take 1 tablet by mouth 2 (two) times daily.      No Known Allergies  Follow-up Information    PCP. Schedule an appointment as soon as possible for a visit in 2 week(s).        obgyn. Schedule an appointment as soon as possible for a visit in 2 week(s).   Why: f/u in 1-2 weeks.               The results of significant diagnostics from this hospitalization (including imaging, microbiology, ancillary and laboratory) are listed below for reference.    Significant Diagnostic Studies: US Pelvis Complete  Result Date: 06/05/2020 CLINICAL DATA:  Initial evaluation for pelvic pain for 2 days, menstrual cycle. EXAM: TRANSABDOMINAL ULTRASOUND OF PELVIS DOPPLER ULTRASOUND OF OVARIES TECHNIQUE: Transabdominal ultrasound examination of the pelvis was performed including evaluation of the uterus, ovaries, adnexal regions, and pelvic cul-de-sac. Color and duplex Doppler ultrasound was utilized to evaluate blood flow to the ovaries. COMPARISON:  Prior CT from earlier the same day. FINDINGS: Uterus Measurements: 9.1 x 3.8 x 4.3 cm = volume: 77.4 mL. No fibroids or other mass visualized.  Endometrium Thickness: 6.8 mm. No focal abnormality visualized. Trace fluid within the endometrial cavity, presumably related to menstrual cycle. Right ovary Measurements: 3.8 x 2.5 x 3.1 cm = volume: 15.3 mL. Normal appearance/no adnexal mass. 2.2 cm simple cyst, corresponding with finding on prior CT. No internal complexity, solid nodularity, or vascularity. Left ovary Not visualized.  No adnexal mass. Pulsed Doppler evaluation demonstrates normal low-resistance arterial and venous waveforms in the right ovary. Other: No free fluid. IMPRESSION: 1. 2.2 cm simple right ovarian cyst. No followup imaging recommended. Note: This recommendation does not apply to premenarchal patients or to those with increased risk (genetic, family history, elevated tumor markers or other high-risk factors) of ovarian cancer. Reference: Radiology 2019 Nov; 293(2):359-371. 2. Nonvisualization of the left ovary. No other adnexal mass or free fluid. No evidence for ovarian torsion. 3. Normal sonographic appearance of the uterus and endometrium. Trace fluid within the endometrial cavity likely related to current menstrual cycle. Electronically Signed   By: Rise MuBenjamin  McClintock M.D.   On: 06/05/2020 00:04   CT ABDOMEN PELVIS W CONTRAST  Result Date: 06/04/2020 CLINICAL DATA:  Vomiting and weakness for 2 days EXAM: CT ABDOMEN AND PELVIS WITH CONTRAST TECHNIQUE: Multidetector CT imaging of the abdomen and pelvis was performed using the standard protocol following bolus administration of intravenous contrast. CONTRAST:  OMNIPAQUE IOHEXOL 300 MG/ML  SOLN COMPARISON:  None. FINDINGS: Lower chest: No acute abnormality. Hepatobiliary: No focal liver abnormality is seen. No gallstones, gallbladder wall thickening, or biliary dilatation. Pancreas: Unremarkable. No pancreatic ductal dilatation or surrounding inflammatory changes. Spleen: Normal in size without focal abnormality. Adrenals/Urinary Tract: Adrenal glands are within normal limits.  Kidneys demonstrate a normal enhancement pattern bilaterally. No renal calculi or obstructive changes are seen. Bladder is partially distended and within normal limits. Stomach/Bowel: The appendix is within normal limits. No inflammatory changes are seen. No obstructive or inflammatory changes of the colon or small bowel are noted. The stomach is decompressed. Vascular/Lymphatic: No significant vascular findings are present. No enlarged abdominal or pelvic lymph nodes. Reproductive: Uterus is within normal limits. Fluid is noted within the endometrial canal likely related patient's menstrual status. Some simple cysts are noted within the right ovary. The largest of these measures 2.6 cm in greatest dimension. Other: Minimal free fluid is noted likely physiologic in nature. Musculoskeletal: No acute or significant osseous findings. IMPRESSION: No renal calculi or obstructive changes. Simple appearing cyst within the right ovary measuring 2.6 cm. No follow-up imaging recommended. Note: This recommendation does not apply to premenarchal patients and to those with increased risk (genetic, family history, elevated tumor markers or other high-risk factors) of ovarian cancer. Reference: JACR 2020 Feb; 17(2):248-254 Electronically Signed   By: Alcide Clever M.D.   On: 06/04/2020 22:54   [place] Art/Ven Flow Abd Pelv Doppler  Result Date: 06/05/2020 CLINICAL DATA:  Initial evaluation for pelvic pain for 2 days, menstrual cycle. EXAM: TRANSABDOMINAL ULTRASOUND OF PELVIS DOPPLER ULTRASOUND OF OVARIES TECHNIQUE: Transabdominal ultrasound examination of the pelvis was performed including evaluation of the uterus, ovaries, adnexal regions, and pelvic cul-de-sac. Color and duplex Doppler ultrasound was utilized to evaluate blood flow to the ovaries. COMPARISON:  Prior CT from earlier the same day. FINDINGS: Uterus Measurements: 9.1 x 3.8 x 4.3 cm = volume: 77.4 mL. No fibroids or other mass visualized. Endometrium Thickness: 6.8 mm.  No focal abnormality visualized. Trace fluid within the endometrial cavity, presumably related to menstrual cycle. Right ovary Measurements: 3.8 x 2.5 x 3.1 cm = volume: 15.3 mL. Normal appearance/no adnexal mass. 2.2 cm simple cyst, corresponding with finding on prior CT. No internal complexity, solid nodularity, or vascularity. Left ovary Not visualized.  No adnexal mass. Pulsed Doppler evaluation demonstrates normal low-resistance arterial and venous waveforms in the right ovary. Other: No free fluid. IMPRESSION: 1. 2.2 cm simple right ovarian cyst. No followup imaging recommended. Note: This recommendation does not apply to premenarchal patients or to those with increased risk (genetic, family history, elevated tumor markers or other high-risk factors) of ovarian cancer. Reference: Radiology 2019 Nov; 293(2):359-371. 2. Nonvisualization of the left ovary. No other adnexal mass or free fluid. No evidence for ovarian torsion. 3. Normal sonographic appearance of the uterus and endometrium. Trace fluid within the endometrial cavity likely related to current menstrual cycle. Electronically Signed   By: Rise Mu M.D.   On: 06/05/2020 00:04    Microbiology: Recent Results (from the past 240 hour(s))  Resp Panel by RT-PCR (Flu A&B, Covid) Nasopharyngeal Swab     Status: None   Collection Time: 06/04/20 10:22 PM   Specimen: Nasopharyngeal Swab;  Nasopharyngeal(NP) swabs in vial transport medium  Result Value Ref Range Status   SARS Coronavirus 2 by RT PCR NEGATIVE NEGATIVE Final    Comment: (NOTE) SARS-CoV-2 target nucleic acids are NOT DETECTED.  The SARS-CoV-2 RNA is generally detectable in upper respiratory specimens during the acute phase of infection. The lowest concentration of SARS-CoV-2 viral copies this assay can detect is 138 copies/mL. A negative result does not preclude SARS-Cov-2 infection and should not be used as the sole basis for treatment or other patient management  decisions. A negative result may occur with  improper specimen collection/handling, submission of specimen other than nasopharyngeal swab, presence of viral mutation(s) within the areas targeted by this assay, and inadequate number of viral copies(<138 copies/mL). A negative result must be combined with clinical observations, patient history, and epidemiological information. The expected result is Negative.  Fact Sheet for Patients:  BloggerCourse.com  Fact Sheet for Healthcare Providers:  SeriousBroker.it  This test is no t yet approved or cleared by the [place] FDA and  has been authorized for detection and/or diagnosis of SARS-CoV-2 by FDA under an Emergency Use Authorization (EUA). This EUA will remain  in effect (meaning this test can be used) for the duration of the COVID-19 declaration under Section 564(b)(1) of the Act, 21 U.S.C.section 360bbb-3(b)(1), unless the authorization is terminated  or revoked sooner.       Influenza A by PCR NEGATIVE NEGATIVE Final   Influenza B by PCR NEGATIVE NEGATIVE Final    Comment: (NOTE) The Xpert Xpress SARS-CoV-2/FLU/RSV plus assay is intended as an aid in the diagnosis of influenza from Nasopharyngeal swab specimens and should not be used as a sole basis for treatment. Nasal washings and aspirates are unacceptable for Xpert Xpress SARS-CoV-2/FLU/RSV testing.  Fact Sheet for Patients: BloggerCourse.com  Fact Sheet for Healthcare Providers: SeriousBroker.it  This test is not yet approved or cleared by the [place] FDA and has been authorized for detection and/or diagnosis of SARS-CoV-2 by FDA under an Emergency Use Authorization (EUA). This EUA will remain in effect (meaning this test can be used) for the duration of the COVID-19 declaration under Section 564(b)(1) of the Act, 21 U.S.C. section 360bbb-3(b)(1), unless the  authorization is terminated or revoked.  Performed at [facility], [address], [place], [place] [address]      Labs: Basic Metabolic Panel: Recent Labs  Lab 06/04/20 2018 06/05/20 0903  NA 137 140  K 3.3* 3.3*  CL 107 110  CO2 22 19*  GLUCOSE 97 83  BUN 6 <5*  CREATININE 0.64 0.53  CALCIUM 9.0 8.4*  MG  --  2.0   Liver Function Tests: Recent Labs  Lab 06/04/20 2018  AST 20  ALT 12  ALKPHOS 40  BILITOT 0.8  PROT 7.6  ALBUMIN 4.7   Recent Labs  Lab 06/04/20 2018  LIPASE 31   No results for input(s): AMMONIA in the last 168 hours. CBC: Recent Labs  Lab 06/04/20 2018 06/04/20 2151 06/05/20 0903  WBC 7.6  --  7.6  NEUTROABS  --   --  4.9  HGB 4.9* 4.6* 7.7*  HCT 20.5* 19.6* 28.7*  MCV 61.0*  --  70.0*  PLT 170  --  143*   Cardiac Enzymes: No results for input(s): CKTOTAL, CKMB, CKMBINDEX, TROPONINI in the last 168 hours. BNP: BNP (last 3 results) No results for input(s): BNP in the last 8760 hours.  ProBNP (last 3 results) No results for input(s): PROBNP in the last  8760 hours.  CBG: No results for input(s): GLUCAP in the last 168 hours.     Signed:  Ramiro Harvest MD.  [facility] 06/05/2020, 3:48 PM

## 2020-06-05 NOTE — Progress Notes (Signed)
Pt admitted from ED with first unit of blood transfusing, no issues noted. Pt reported continuing nausea after zofran, MD notified and new orders received. Discussed plan of care with pt and answered questions. Continue to monitor. Mick Sell RN

## 2020-06-06 LAB — BPAM RBC
Blood Product Expiration Date: 202204012359
Blood Product Expiration Date: 202204012359
ISSUE DATE / TIME: 202203070015
ISSUE DATE / TIME: 202203070329
Unit Type and Rh: 5100
Unit Type and Rh: 5100

## 2020-06-06 LAB — TYPE AND SCREEN
ABO/RH(D): O POS
Antibody Screen: NEGATIVE
Unit division: 0
Unit division: 0

## 2020-06-07 LAB — URINE CULTURE

## 2020-11-27 ENCOUNTER — Ambulatory Visit: Payer: Self-pay | Attending: Internal Medicine

## 2020-11-27 ENCOUNTER — Other Ambulatory Visit (HOSPITAL_BASED_OUTPATIENT_CLINIC_OR_DEPARTMENT_OTHER): Payer: Self-pay

## 2020-11-27 ENCOUNTER — Other Ambulatory Visit: Payer: Self-pay

## 2020-11-27 DIAGNOSIS — Z23 Encounter for immunization: Secondary | ICD-10-CM

## 2020-11-27 MED ORDER — PFIZER-BIONT COVID-19 VAC-TRIS 30 MCG/0.3ML IM SUSP
INTRAMUSCULAR | 0 refills | Status: DC
  Filled 2020-11-27: qty 0.3, 1d supply, fill #0

## 2020-11-27 NOTE — Progress Notes (Signed)
   Covid-19 Vaccination Clinic  Name:  Michele Castaneda    MRN: [id number] DOB: 08/25/1995  11/27/2020  Ms. Michele Castaneda was observed post Covid-19 immunization for 15 minutes without incident. She was provided with Vaccine Information Sheet and instruction to access the V-Safe system.   Ms. Michele Castaneda was instructed to call 911 with any severe reactions post vaccine: Difficulty breathing  Swelling of face and throat  A fast heartbeat  A bad rash all over body  Dizziness and weakness   Immunizations Administered     Name Date Dose VIS Date Route   PFIZER Comrnaty(Gray TOP) Covid-19 Vaccine 11/27/2020  1:10 PM 0.3 mL 03/09/2020 Intramuscular   Manufacturer: [employer], [employer]   Lot: [id number]   NDC: [phone]

## 2021-12-13 ENCOUNTER — Other Ambulatory Visit (HOSPITAL_BASED_OUTPATIENT_CLINIC_OR_DEPARTMENT_OTHER): Payer: Self-pay

## 2022-03-04 IMAGING — US US PELVIS COMPLETE
1 series · 13 of 25 positions shown · non-contrast
Comparison: Prior CT from earlier the same day.

CLINICAL DATA: Initial evaluation for pelvic pain for 2 days,
menstrual cycle.

EXAM:
TRANSABDOMINAL ULTRASOUND OF PELVIS
DOPPLER ULTRASOUND OF OVARIES
TECHNIQUE: Transabdominal ultrasound examination of the pelvis was performed
including evaluation of the uterus, ovaries, adnexal regions, and
pelvic cul-de-sac.
Color and duplex Doppler ultrasound was utilized to evaluate blood
flow to the ovaries.

[Series 1: us pelvis complete · 13 of 37 slices shown]
[im 1/37]
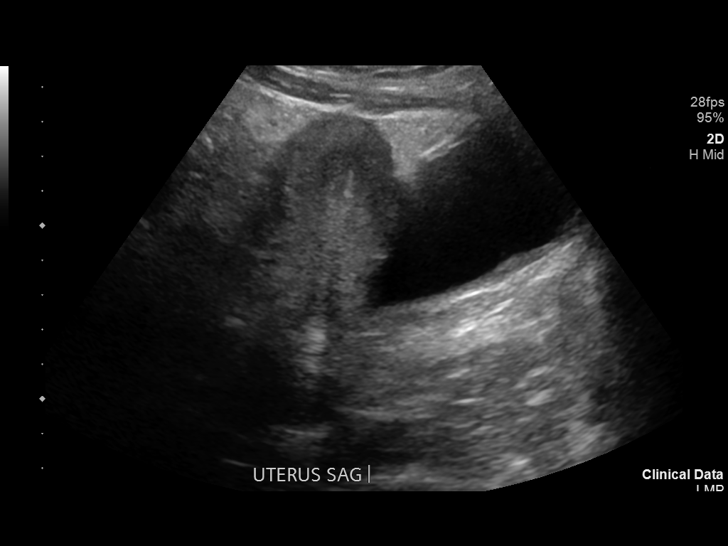
[im 4/37]
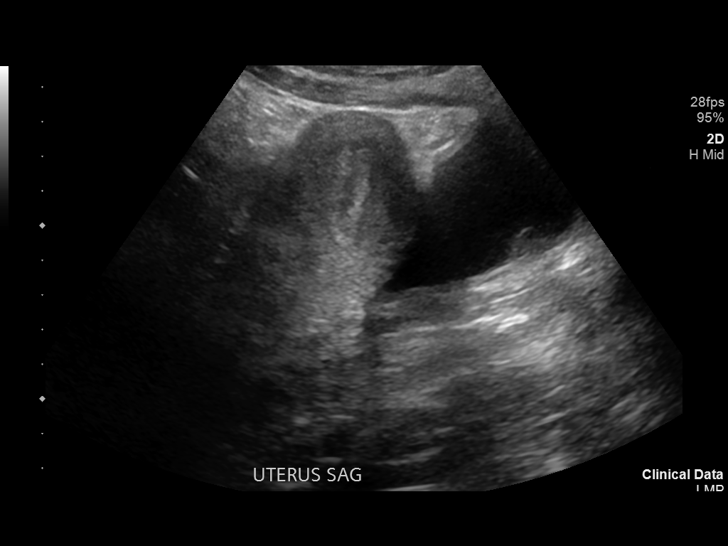
[im 7/37]
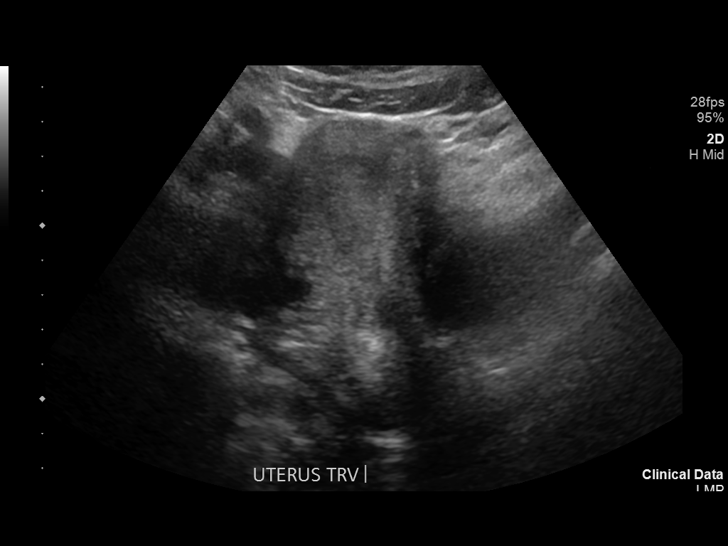
[im 10/37]
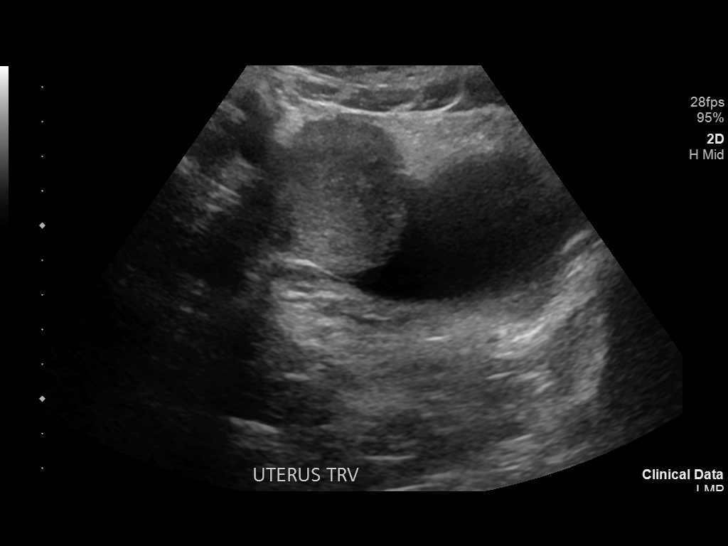
[im 13/37]
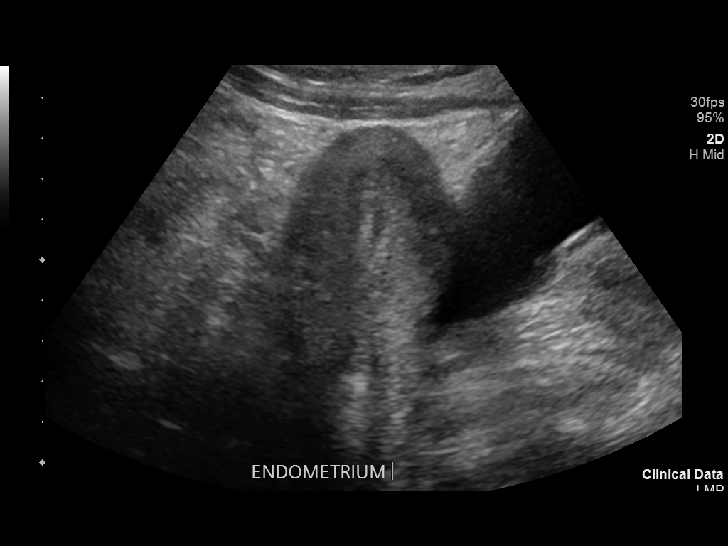
[im 16/37]
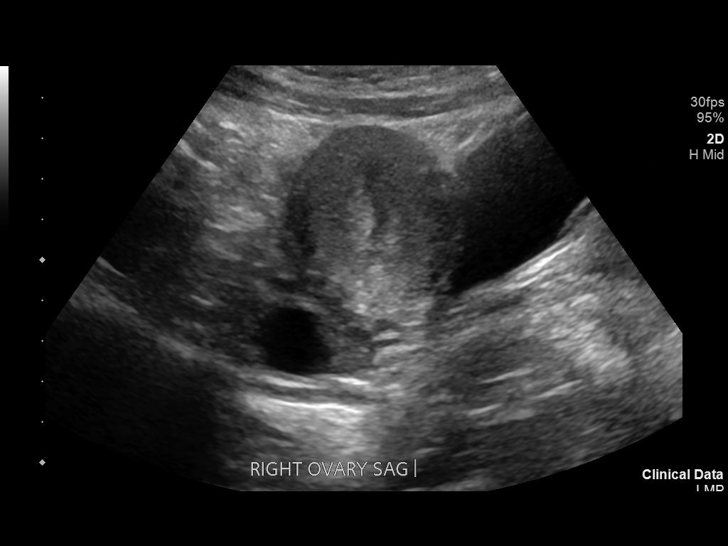
[im 19/37]
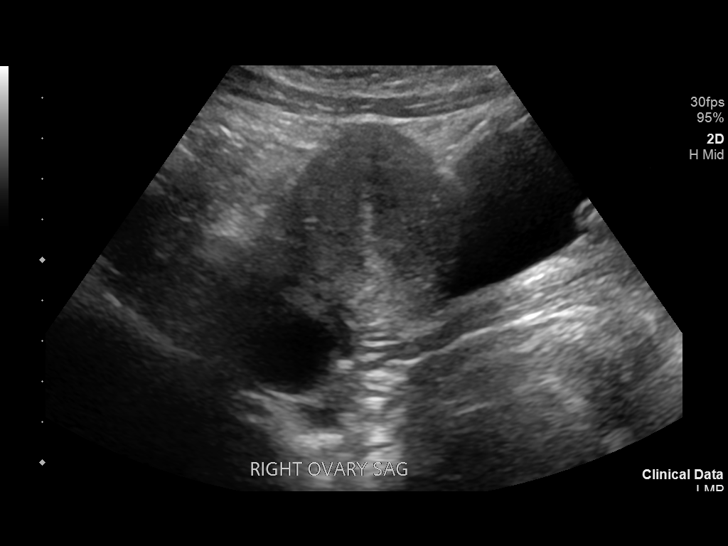
[im 22/37]
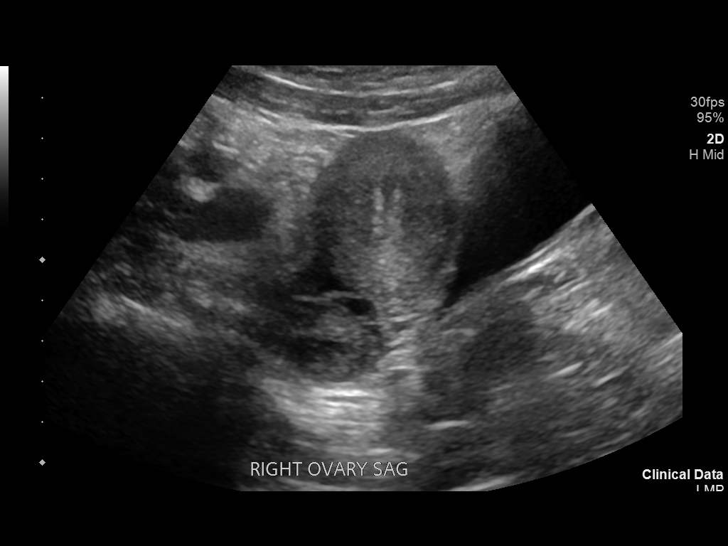
[im 25/37]
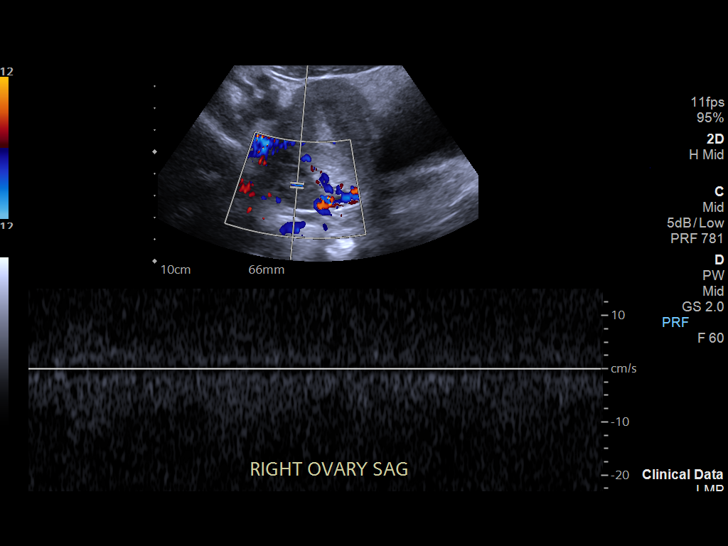
[im 28/37]
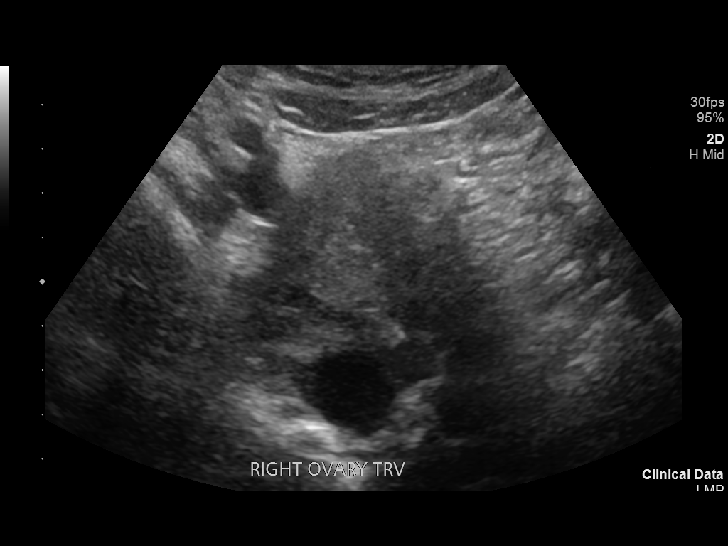
[im 31/37]
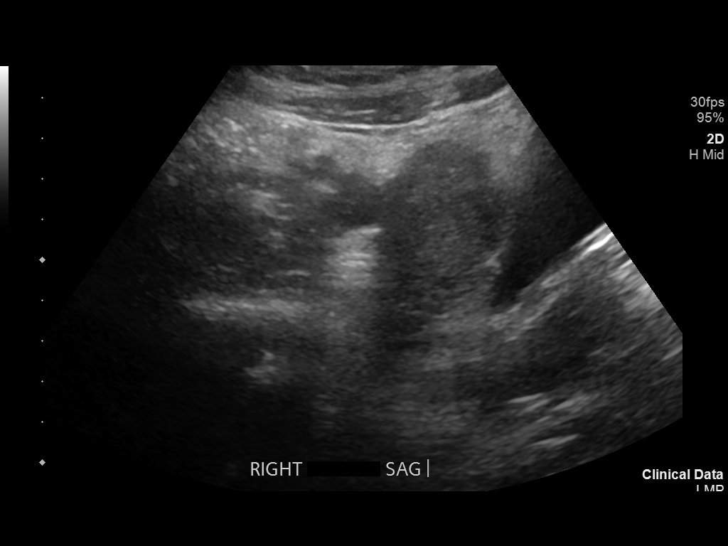
[im 34/37]
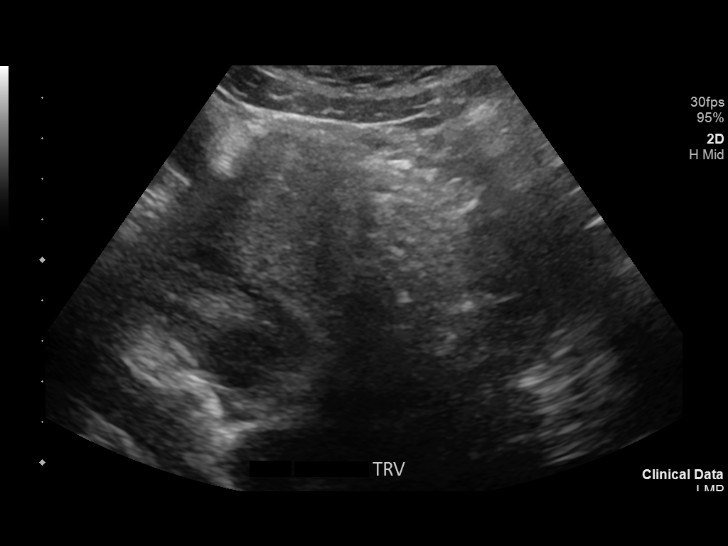
[im 37/37]
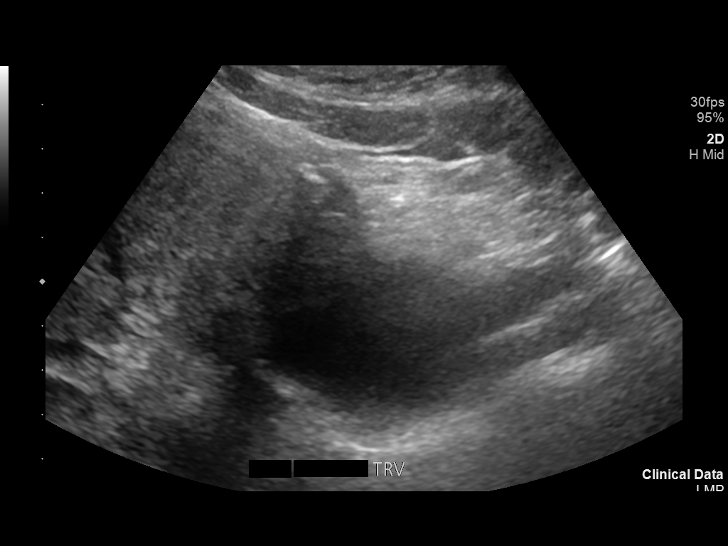

[13 of 25 positions shown; findings below may reference images not displayed]

FINDINGS: Uterus

Measurements: 9.1 x 3.8 x 4.3 cm = volume: 77.4 mL. No fibroids or
other mass visualized.

Endometrium

Thickness: 6.8 mm. No focal abnormality visualized. Trace fluid
within the endometrial cavity, presumably related to menstrual
cycle.

Right ovary

Measurements: 3.8 x 2.5 x 3.1 cm = volume: 15.3 mL. Normal
appearance/no adnexal mass. 2.2 cm simple cyst, corresponding with
finding on prior CT. No internal complexity, solid nodularity, or
vascularity.

Left ovary

Not visualized.  No adnexal mass.

Pulsed Doppler evaluation demonstrates normal low-resistance
arterial and venous waveforms in the right ovary.

Other: No free fluid.
IMPRESSION: 1. 2.2 cm simple right ovarian cyst. No followup imaging
recommended. Note: This recommendation does not apply to
premenarchal patients or to those with increased risk (genetic,
family history, elevated tumor markers or other high-risk factors)
of ovarian cancer. Reference: Radiology [DATE]):359-371.
2. Nonvisualization of the left ovary. No other adnexal mass or free
fluid. No evidence for ovarian torsion.
3. Normal sonographic appearance of the uterus and endometrium.
Trace fluid within the endometrial cavity likely related to current
menstrual cycle.

## 2022-03-04 IMAGING — CT CT ABD-PELV W/ CM
2 of 4 series · 15 of 46 positions shown, 17 images · IV contrast (omnipaque)
Comparison: None.

CLINICAL DATA: Vomiting and weakness for 2 days

EXAM:
CT ABDOMEN AND PELVIS WITH CONTRAST
TECHNIQUE: Multidetector CT imaging of the abdomen and pelvis was performed
using the standard protocol following bolus administration of
intravenous contrast.
CONTRAST:  100mL OMNIPAQUE IOHEXOL 300 MG/ML  SOLN

[Series 2: axial st · axial · 0.67mm/px · z∈[-472,-82]mm · 12 of 88 slices shown, 14 images]
[im 5/88  soft-tissue]
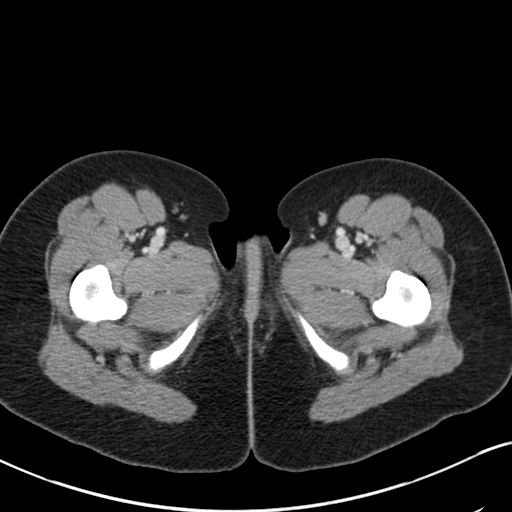
[im 5/88  bone]
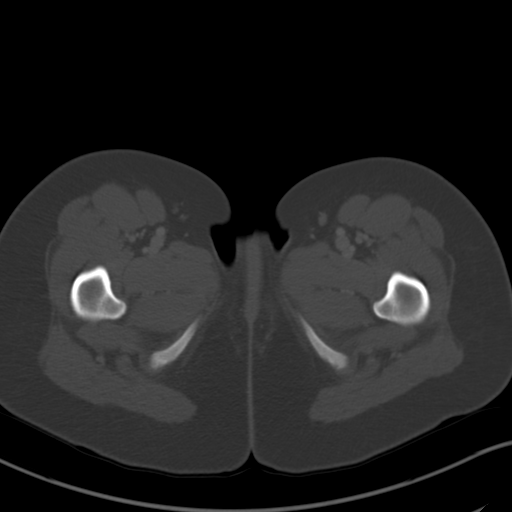
[im 14/88  soft-tissue]
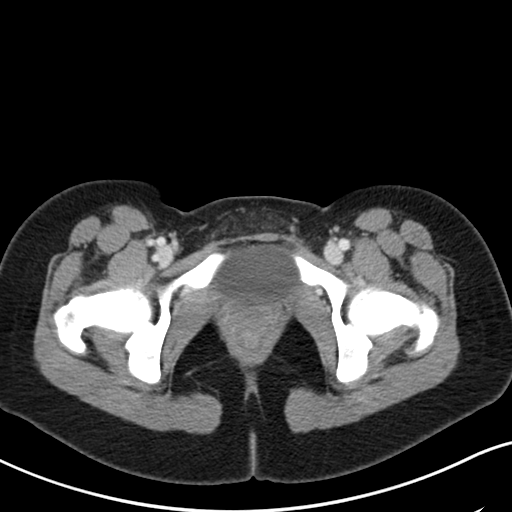
[im 19/88  soft-tissue]
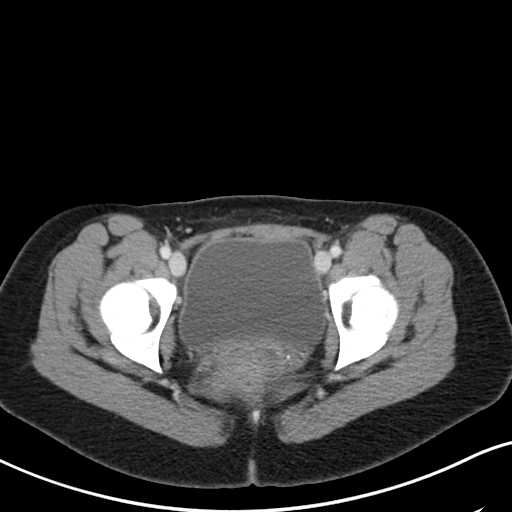
[im 28/88  soft-tissue]
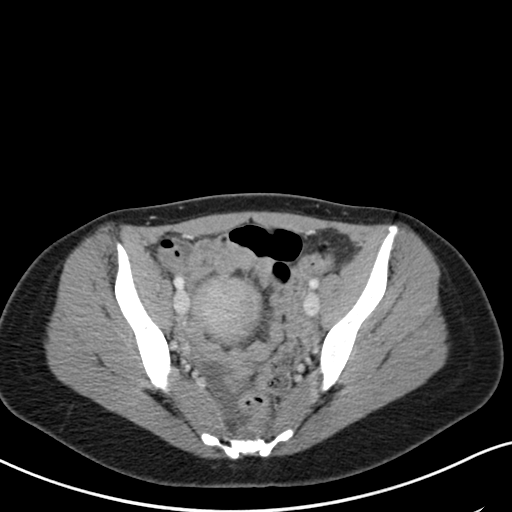
[im 33/88  soft-tissue]
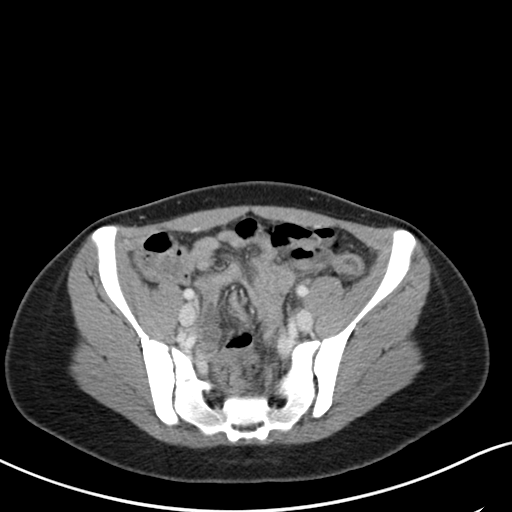
[im 42/88  soft-tissue]
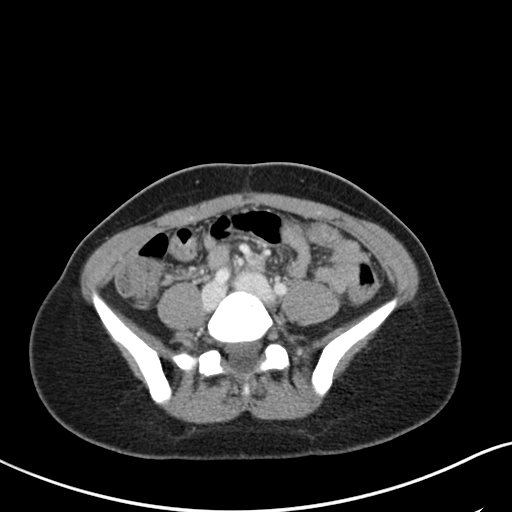
[im 46/88  soft-tissue]
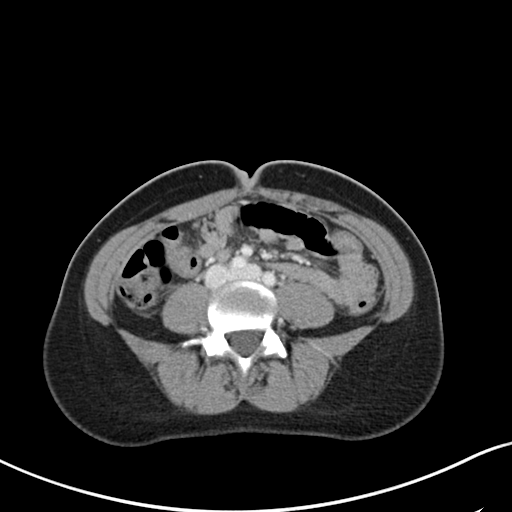
[im 55/88  soft-tissue]
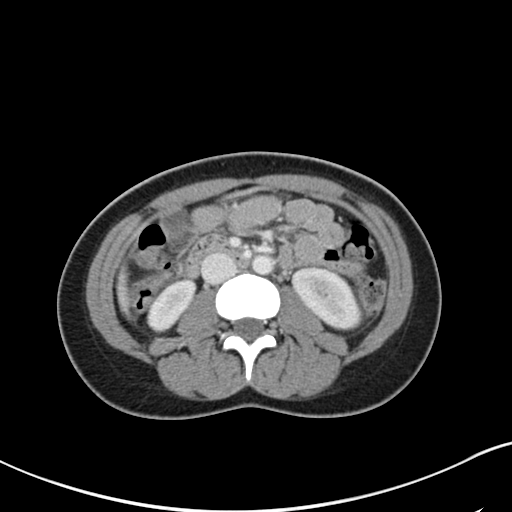
[im 60/88  soft-tissue]
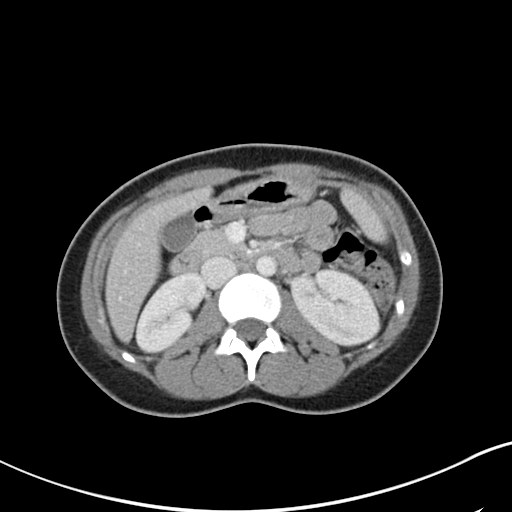
[im 60/88  bone]
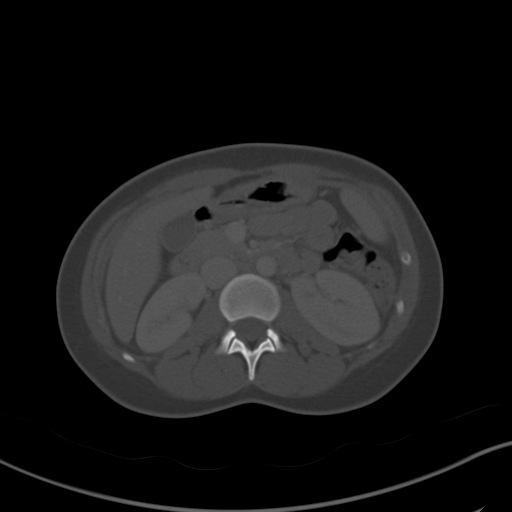
[im 69/88  soft-tissue]
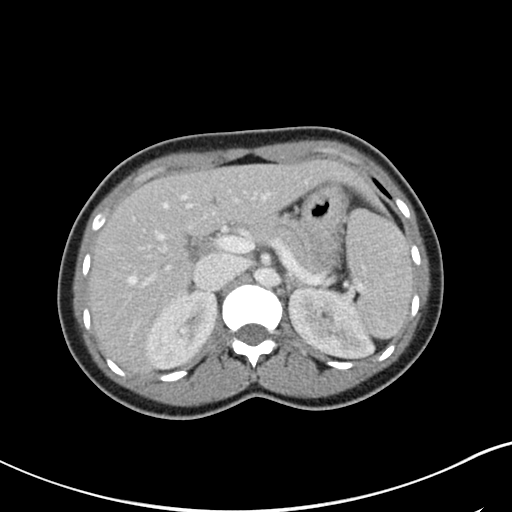
[im 74/88  soft-tissue]
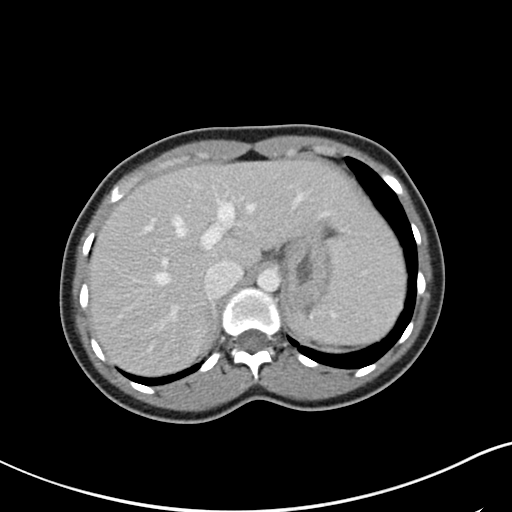
[im 83/88  soft-tissue]
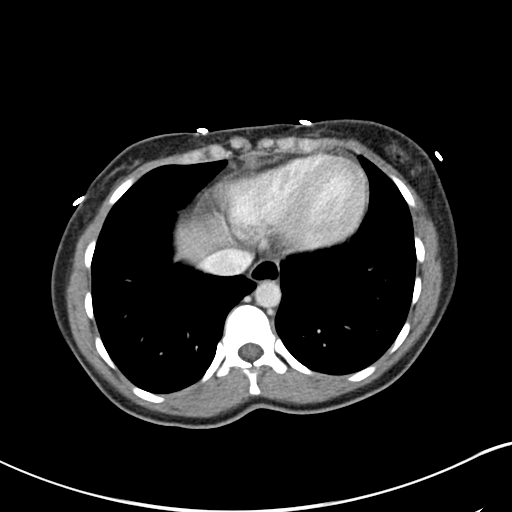

[Series 5: coronal st · coronal · 0.61mm/px · 3 of 128 slices shown]
[im 43/128  soft-tissue]
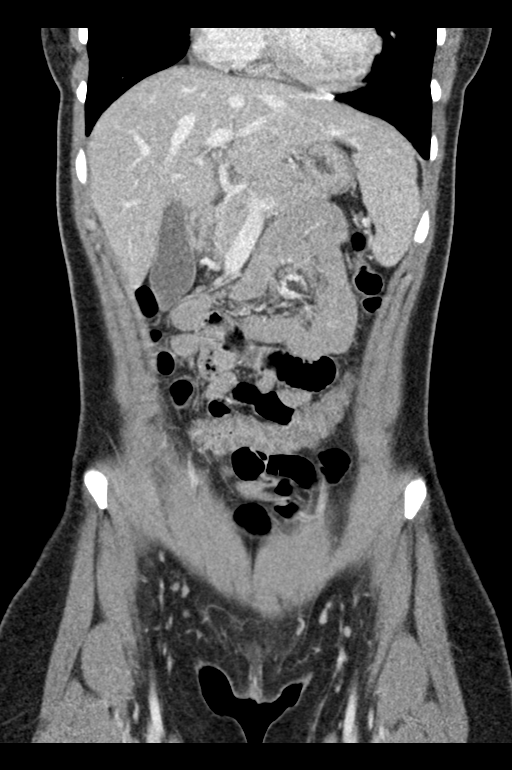
[im 57/128  soft-tissue]
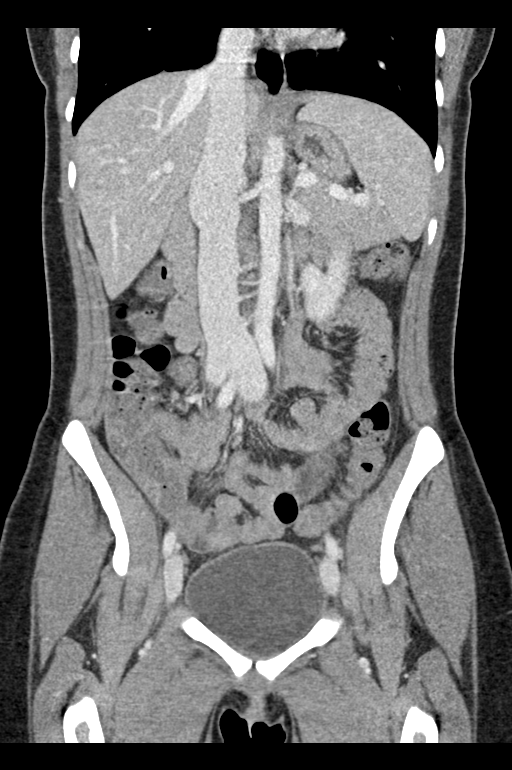
[im 71/128  soft-tissue]
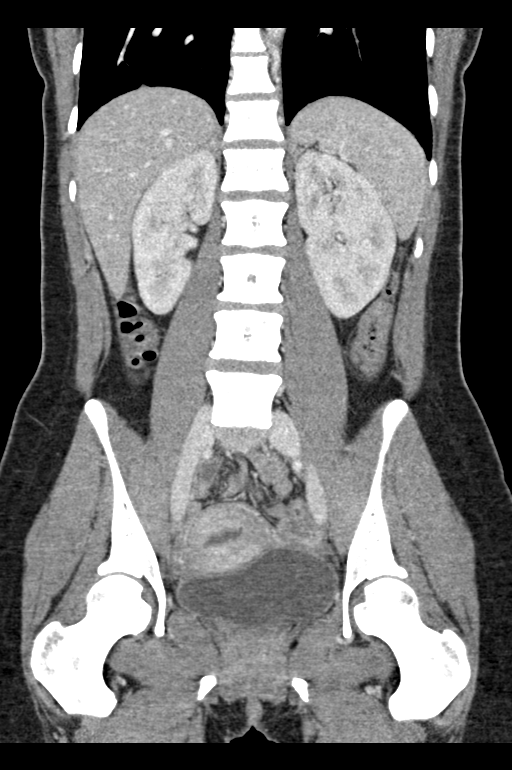

[15 of 46 positions shown; findings below may reference images not displayed]

FINDINGS: Lower chest: No acute abnormality.

Hepatobiliary: No focal liver abnormality is seen. No gallstones,
gallbladder wall thickening, or biliary dilatation.

Pancreas: Unremarkable. No pancreatic ductal dilatation or
surrounding inflammatory changes.

Spleen: Normal in size without focal abnormality.

Adrenals/Urinary Tract: Adrenal glands are within normal limits.
Kidneys demonstrate a normal enhancement pattern bilaterally. No
renal calculi or obstructive changes are seen. Bladder is partially
distended and within normal limits.

Stomach/Bowel: The appendix is within normal limits. No inflammatory
changes are seen. No obstructive or inflammatory changes of the
colon or small bowel are noted. The stomach is decompressed.

Vascular/Lymphatic: No significant vascular findings are present. No
enlarged abdominal or pelvic lymph nodes.

Reproductive: Uterus is within normal limits. Fluid is noted within
the endometrial canal likely related patient's menstrual status.
Some simple cysts are noted within the right ovary. The largest of
these measures 2.6 cm in greatest dimension.

Other: Minimal free fluid is noted likely physiologic in nature.

Musculoskeletal: No acute or significant osseous findings.
IMPRESSION: No renal calculi or obstructive changes.

Simple appearing cyst within the right ovary measuring 2.6 cm. No
follow-up imaging recommended. Note: This recommendation does not
apply to premenarchal patients and to those with increased risk
(genetic, family history, elevated tumor markers or other high-risk
factors) of ovarian cancer. Reference: JACR [DATE]):248-254

## 2023-04-23 NOTE — Progress Notes (Signed)
 Michele Castaneda is a 28 y.o. with  Active Ambulatory Problems    Diagnosis Date Noted  . Migraine 12/08/2019  . Menorrhagia 06/04/2020  . Asthma 12/08/2019  . Anemia 06/04/2020   Resolved Ambulatory Problems    Diagnosis Date Noted  . No Resolved Ambulatory Problems   No Additional Past Medical History   who presents today at Urgent Care for  Chief Complaint  Patient presents with  . Migraine    Started yesterday, got worse last night. Pt has tried Excedrin.    . Generalized Body Aches    Started last night w/ possible fever     29 year old female complaining of extreme headache which is worse behind the eyes that started yesterday.  She does have a history of migraines.  She has tried Excedrin which was mildly helpful.  She also endorses generalized bodyaches worse in the hips and thighs.  She thinks that she may have a fever but did not check with thermometer.     has no history on file for tobacco use.  Review of Systems  Constitutional:  Positive for appetite change, chills, fatigue and fever.  Gastrointestinal:  Positive for nausea.  Neurological:  Positive for headaches.  All other systems reviewed and are negative.      Physical Exam  BP 133/77   Pulse 110   Temp (!) 102.9 F (39.4 C) (Oral)   Resp 18   Ht 1.626 m (5' 4)   Wt 61.2 kg (135 lb)   LMP 04/03/2023 (Exact Date)   SpO2 100%   BMI 23.17 kg/m   Constitutional:      General: Patient is not in acute distress.    Appearance: Appears fatigued and uncomfortable  neurological:     General: No focal deficit present.     Mental Status: alert and oriented to person, place, and time.  HENT:     Eyes:     Conjunctiva/sclera: Conjunctivae normal. Pharynx normal    There is no  associated anterior cervical lymphadenopathy    Pupils: Pupils are equal, round, and reactive to light.     TM's normal with no visible effusion Cardiovascular:     Heart sounds: Normal heart sounds. No murmur heard.    No  Lower extremity edema noted Pulmonary:     Effort: Pulmonary effort is normal. No respiratory distress.     Breath sounds: No wheezing, rhonchi or rales.  Abdominal:     General: Bowel sounds are normal. There is no distension.     Tenderness: There is no abdominal tenderness.  Musculoskeletal:        General: Normal range of motion.     Neck:  No rigidity.  Skin:    General: Skin is warm and dry.  Psychiatric:        Mood and Affect: Mood normal.   No orders to display     Lab Results (last 24 hours)     Procedure Component Value Ref Range Date/Time   POC Influenza A&B NAT (IDNOW) [[id number]]  (Abnormal) Collected: 04/23/23 1852   Lab Status: Final result Specimen: Swab from Nasal Cavity Updated: 04/23/23 1852    Influenza A RNA Positive* Negative     Influenza B RNA Indeterminate* Negative           DIAGNOSIS/PLAN 1. Influenza A (Primary) - oseltamivir (TAMIFLU) 75 mg capsule; Take 1 capsule (75 mg total) by mouth 2 (two) times a day for 10 days.  Dispense: 20 capsule; Refill: 0  2.  Intractable migraine without status migrainosus, unspecified migraine type  3. Flu-like symptoms - POC Influenza A&B NAT (IDNOW) - POC SARS-COV-2 SYMPTOMATIC (IDNOW)  4. Nausea - ondansetron  (ZOFRAN -ODT) 4 mg disintegrating tablet; Dissolve 1 tablet (4 mg total) on tongue every 8 (eight) hours as needed for nausea or vomiting.  Dispense: 20 tablet; Refill: 0       We discussed risks and side effects of medications, and also discussed red flags which would warrant immediate follow-up.       28 year old female complaining of extreme headache which is worse behind the eyes that started yesterday.  She does have a history of migraines.  She has tried Excedrin which was mildly helpful.  She also endorses generalized bodyaches worse in the hips and thighs.  She thinks that she may have a fever but did not check with thermometer.  She is flu a positive.  She did want Tamiflu.  Tamiflu sent  to the pharmacy as well as ondansetron  for nausea.       *Some images could not be shown.

## 2023-10-13 ENCOUNTER — Encounter: Payer: Self-pay | Admitting: Physician Assistant

## 2023-10-13 ENCOUNTER — Ambulatory Visit: Admitting: Physician Assistant

## 2023-10-13 VITALS — BP 111/75 | HR 86 | Ht 64.0 in

## 2023-10-13 DIAGNOSIS — Z0289 Encounter for other administrative examinations: Secondary | ICD-10-CM

## 2023-10-13 DIAGNOSIS — Z111 Encounter for screening for respiratory tuberculosis: Secondary | ICD-10-CM | POA: Diagnosis not present

## 2023-10-13 DIAGNOSIS — D649 Anemia, unspecified: Secondary | ICD-10-CM

## 2023-10-13 NOTE — Patient Instructions (Addendum)
 VISIT SUMMARY:  Today, you came in for a health assessment. We discussed your history of anemia. We also talked about establishing regular healthcare for annual exams and general health maintenance.  YOUR PLAN:  -TUBERCULOSIS SCREENING: You had a TB test done, and the results are still pending. Tuberculosis is a bacterial infection that mainly affects the lungs. Please contact [facility] to get your TB test results.  -ANEMIA: Anemia is a condition where you don't have enough healthy red blood cells to carry adequate oxygen to your body's tissues. You have a history of severe anemia and are currently managing it with iron  supplements and a diet rich in leafy greens. We will order a blood test to check your hemoglobin and iron  levels. Based on the results, we may adjust your iron  supplementation.   Health Maintenance, Female Adopting a healthy lifestyle and getting preventive care are important in promoting health and wellness. Ask your health care provider about: The right schedule for you to have regular tests and exams. Things you can do on your own to prevent diseases and keep yourself healthy. What should I know about diet, weight, and exercise? Eat a healthy diet  Eat a diet that includes plenty of vegetables, fruits, low-fat dairy products, and lean protein. Do not eat a lot of foods that are high in solid fats, added sugars, or sodium. Maintain a healthy weight Body mass index (BMI) is used to identify weight problems. It estimates body fat based on height and weight. Your health care provider can help determine your BMI and help you achieve or maintain a healthy weight. Get regular exercise Get regular exercise. This is one of the most important things you can do for your health. Most adults should: Exercise for at least 150 minutes each week. The exercise should increase your heart rate and make you sweat (moderate-intensity exercise). Do strengthening exercises at least twice a week.  This is in addition to the moderate-intensity exercise. Spend less time sitting. Even light physical activity can be beneficial. Watch cholesterol and blood lipids Have your blood tested for lipids and cholesterol at 26 years of age, then have this test every 5 years. Have your cholesterol levels checked more often if: Your lipid or cholesterol levels are high. You are older than 26 years of age. You are at high risk for heart disease. What should I know about cancer screening? Depending on your health history and family history, you may need to have cancer screening at various ages. This may include screening for: Breast cancer. Cervical cancer. Colorectal cancer. Skin cancer. Lung cancer. What should I know about heart disease, diabetes, and high blood pressure? Blood pressure and heart disease High blood pressure causes heart disease and increases the risk of stroke. This is more likely to develop in people who have high blood pressure readings or are overweight. Have your blood pressure checked: Every 3-5 years if you are 50-50 years of age. Every year if you are 71 years old or older. Diabetes Have regular diabetes screenings. This checks your fasting blood sugar level. Have the screening done: Once every three years after age 18 if you are at a normal weight and have a low risk for diabetes. More often and at a younger age if you are overweight or have a high risk for diabetes. What should I know about preventing infection? Hepatitis B If you have a higher risk for hepatitis B, you should be screened for this virus. Talk with your health care provider to  find out if you are at risk for hepatitis B infection. Hepatitis C Testing is recommended for: Everyone born from [birth year] through 1963. Anyone with known risk factors for hepatitis C. Sexually transmitted infections (STIs) Get screened for STIs, including gonorrhea and chlamydia, if: You are sexually active and are younger than  26 years of age. You are older than 26 years of age and your health care provider tells you that you are at risk for this type of infection. Your sexual activity has changed since you were last screened, and you are at increased risk for chlamydia or gonorrhea. Ask your health care provider if you are at risk. Ask your health care provider about whether you are at high risk for HIV. Your health care provider may recommend a prescription medicine to help prevent HIV infection. If you choose to take medicine to prevent HIV, you should first get tested for HIV. You should then be tested every 3 months for as long as you are taking the medicine. Pregnancy If you are about to stop having your period (premenopausal) and you may become pregnant, seek counseling before you get pregnant. Take 400 to 800 micrograms (mcg) of folic acid every day if you become pregnant. Ask for birth control (contraception) if you want to prevent pregnancy. Osteoporosis and menopause Osteoporosis is a disease in which the bones lose minerals and strength with aging. This can result in bone fractures. If you are 57 years old or older, or if you are at risk for osteoporosis and fractures, ask your health care provider if you should: Be screened for bone loss. Take a calcium or vitamin D supplement to lower your risk of fractures. Be given hormone replacement therapy (HRT) to treat symptoms of menopause. Follow these instructions at home: Alcohol use Do not drink alcohol if: Your health care provider tells you not to drink. You are pregnant, may be pregnant, or are planning to become pregnant. If you drink alcohol: Limit how much you have to: 0-1 drink a day. Know how much alcohol is in your drink. In the [place] one drink equals one 12 oz bottle of beer (355 mL), one 5 oz glass of wine (148 mL), or one 1 oz glass of hard liquor (44 mL). Lifestyle Do not use any products that contain nicotine or tobacco. These products  include cigarettes, chewing tobacco, and vaping devices, such as e-cigarettes. If you need help quitting, ask your health care provider. Do not use street drugs. Do not share needles. Ask your health care provider for help if you need support or information about quitting drugs. General instructions Schedule regular health, dental, and eye exams. Stay current with your vaccines. Tell your health care provider if: You often feel depressed. You have ever been abused or do not feel safe at home. Summary Adopting a healthy lifestyle and getting preventive care are important in promoting health and wellness. Follow your health care provider's instructions about healthy diet, exercising, and getting tested or screened for diseases. Follow your health care provider's instructions on monitoring your cholesterol and blood pressure. This information is not intended to replace advice given to you by your health care provider. Make sure you discuss any questions you have with your health care provider. Document Revised: 08/07/2020 Document Reviewed: 08/07/2020 Elsevier Patient Education  2024 [employer].

## 2023-10-13 NOTE — Progress Notes (Unsigned)
 New Patient Office Visit  Subjective    Patient ID: Michele Castaneda, female    DOB: Oct 10, 1995  Age: 28 y.o. MRN: [id number]  CC:  Chief Complaint  Patient presents with   Employment Physical    Daycare clearance facility. Quantiferon TB completed on  07.11.2025   Discussed the use of AI scribe software for clinical note transcription with the patient, who gave verbal consent to proceed.  History of Present Illness   Michele Castaneda is a 28 year old female who presents for a health assessment and TB test documentation.  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 Results are expected via email or by visiting the lab.  She has anemia, previously requiring a blood transfusion 3 years ago when hemoglobin was 4.6 g/dL. She did not follow up after the hospitalization. Current levels have not been checked. She manages with over-the-counter iron  supplements and a diet rich in leafy greens. Her heavy menstrual periods have improved. No other medications are taken.  No other concerns at this time.     Outpatient Encounter Medications as of 10/13/2023  Medication Sig   acetaminophen  (TYLENOL ) 500 MG tablet Take 500 mg by mouth every 6 (six) hours as needed for mild pain, fever or headache. (Patient not taking: Reported on 10/13/2023)   COVID-19 mRNA Vac-TriS, Pfizer, (PFIZER-BIONT COVID-19 VAC-TRIS) SUSP injection Inject into the muscle. (Patient not taking: Reported on 10/13/2023)   fluconazole (DIFLUCAN) 150 MG tablet Take 150 mg by mouth once. (Patient not taking: Reported on 10/13/2023)   iron  polysaccharides (NIFEREX) 150 MG capsule Take 1 capsule (150 mg total) by mouth daily. (Patient not taking: Reported on 10/13/2023)   metroNIDAZOLE  (FLAGYL ) 500 MG tablet Take 500 mg by mouth every 8 (eight) hours. 7 day supply (Patient not taking: Reported on 10/13/2023)   senna-docusate (SENOKOT-S) 8.6-50 MG tablet Take 1 tablet by mouth 2 (two)  times daily. (Patient not taking: Reported on 10/13/2023)   No facility-administered encounter medications on file as of 10/13/2023.    Past Medical History:  Diagnosis Date   Anemia    Asthma     History reviewed. No pertinent surgical history.  Family History  Problem Relation Age of Onset   Colon cancer Neg Hx     Social History   Socioeconomic History   Marital status: Single    Spouse name: Not on file   Number of children: Not on file   Years of education: Not on file   Highest education level: Not on file  Occupational History   Not on file  Tobacco Use   Smoking status: Never   Smokeless tobacco: Never  Vaping Use   Vaping status: Never Used  Substance and Sexual Activity   Alcohol use: Never   Drug use: Never   Sexual activity: Not on file  Other Topics Concern   Not on file  Social History Narrative   Not on file   Social Drivers of Health   Financial Resource Strain: Not on file  Food Insecurity: Not on file  Transportation Needs: Not on file  Physical Activity: Not on file  Stress: Not on file  Social Connections: Not on file  Intimate Partner Violence: Not on file    Review of Systems  Constitutional: Negative.   HENT: Negative.    Eyes: Negative.   Respiratory:  Negative for shortness of breath.   Cardiovascular:  Negative for chest pain.  Gastrointestinal: Negative.  Genitourinary: Negative.   Musculoskeletal: Negative.   Skin: Negative.   Neurological: Negative.   Endo/Heme/Allergies: Negative.   Psychiatric/Behavioral: Negative.          Objective    BP 111/75 (BP Location: Left Arm, Patient Position: Sitting, Cuff Size: Normal)   Pulse 86   Ht 5' 4 (1.626 m)   LMP 09/28/2023   SpO2 100%   BMI 21.63 kg/m   Physical Exam Vitals and nursing note reviewed.  Constitutional:      Appearance: Normal appearance.  HENT:     Head: Normocephalic and atraumatic.     Right Ear: External ear normal.     Left Ear: External ear  normal.     Nose: Nose normal.     Mouth/Throat:     Mouth: Mucous membranes are moist.     Pharynx: Oropharynx is clear.  Eyes:     Extraocular Movements: Extraocular movements intact.     Conjunctiva/sclera: Conjunctivae normal.     Pupils: Pupils are equal, round, and reactive to light.  Cardiovascular:     Rate and Rhythm: Normal rate and regular rhythm.     Pulses: Normal pulses.     Heart sounds: Normal heart sounds.  Pulmonary:     Effort: Pulmonary effort is normal.     Breath sounds: Normal breath sounds.  Musculoskeletal:        General: Normal range of motion.     Cervical back: Normal range of motion and neck supple.  Skin:    General: Skin is warm and dry.  Neurological:     General: No focal deficit present.  Psychiatric:        Mood and Affect: Mood normal.        Behavior: Behavior normal.        Thought Content: Thought content normal.        Judgment: Judgment normal.       Assessment & Plan:   Problem List Items Addressed This Visit       Other   Anemia   Relevant Orders   CBC with Differential/Platelet   Iron , TIBC and Ferritin Panel   Other Visit Diagnoses       Encounter for physical examination related to employment    -  Primary      Assessment and Plan Tuberculosis Screening TB test conducted; results pending. Advised to follow up with [facility]. - Contact [facility] for TB test results.  Anemia Severe anemia history with prior transfusion. Current iron  supplementation and dietary intake noted. Discussed iron  dosing adjustment based on future lab results. - Order blood test for hemoglobin and iron  levels.  General Health Maintenance Interest in establishing care for annual exams and health maintenance. - Refer to [facility] provider [facility] [place] for regular healthcare and annual exams.   I have reviewed the patient's medical history (PMH, PSH, Social History, Family History, Medications, and allergies) , and have  been updated if relevant. I spent 30 minutes reviewing chart and  face to face time with patient.    Return in about 3 weeks (around 11/03/2023) for To establish PCP, At [facility].   Michele RAMAN Mayers, PA-C

## 2023-10-16 ENCOUNTER — Ambulatory Visit: Payer: Self-pay | Admitting: Physician Assistant

## 2023-10-16 DIAGNOSIS — D508 Other iron deficiency anemias: Secondary | ICD-10-CM

## 2023-10-16 DIAGNOSIS — Z8742 Personal history of other diseases of the female genital tract: Secondary | ICD-10-CM

## 2023-10-16 LAB — CBC WITH DIFFERENTIAL/PLATELET
Hematocrit: 34.4 % (ref 34.0–46.6)
Hemoglobin: 7.9 g/dL — ABNORMAL LOW (ref 11.1–15.9)
MCH: 18.4 pg — ABNORMAL LOW (ref 26.6–33.0)
MCHC: 23 g/dL — CL (ref 31.5–35.7)
MCV: 80 fL (ref 79–97)
Platelets: 372 x10E3/uL (ref 150–450)
RBC: 4.3 x10E6/uL (ref 3.77–5.28)
RDW: 24.7 % — ABNORMAL HIGH (ref 11.7–15.4)
WBC: 4.5 x10E3/uL (ref 3.4–10.8)

## 2023-10-16 LAB — IRON,TIBC AND FERRITIN PANEL
Ferritin: 4 ng/mL — ABNORMAL LOW (ref 15–150)
Iron Saturation: 3 % — CL (ref 15–55)
Iron: 12 ug/dL — ABNORMAL LOW (ref 27–159)
Total Iron Binding Capacity: 402 ug/dL (ref 250–450)
UIBC: 390 ug/dL (ref 131–425)

## 2023-11-03 ENCOUNTER — Ambulatory Visit: Payer: Self-pay | Admitting: Nurse Practitioner

## 2023-11-03 ENCOUNTER — Ambulatory Visit (INDEPENDENT_AMBULATORY_CARE_PROVIDER_SITE_OTHER): Payer: Self-pay | Admitting: Nurse Practitioner

## 2023-11-03 ENCOUNTER — Encounter: Payer: Self-pay | Admitting: Nurse Practitioner

## 2023-11-03 VITALS — BP 102/75 | HR 72 | Temp 97.3°F | Ht 64.0 in | Wt 125.0 lb

## 2023-11-03 DIAGNOSIS — D500 Iron deficiency anemia secondary to blood loss (chronic): Secondary | ICD-10-CM | POA: Diagnosis not present

## 2023-11-03 DIAGNOSIS — F419 Anxiety disorder, unspecified: Secondary | ICD-10-CM | POA: Diagnosis not present

## 2023-11-03 DIAGNOSIS — F32A Depression, unspecified: Secondary | ICD-10-CM | POA: Diagnosis not present

## 2023-11-03 MED ORDER — FERROUS SULFATE 325 (65 FE) MG PO TBEC: 325.0000 mg | DELAYED_RELEASE_TABLET | Freq: Every day | ORAL | 1 refills | Status: DC

## 2023-11-03 NOTE — Assessment & Plan Note (Signed)
    11/03/2023    9:33 AM 10/13/2023   12:44 PM  GAD 7 : Generalized Anxiety Score  Nervous, Anxious, on Edge 3 0  Control/stop worrying 1 0  Worry too much - different things 1 0  Trouble relaxing 0 0  Restless 2 0  Easily annoyed or irritable 0 0  Afraid - awful might happen 0 0  Total GAD 7 Score 7 0  Anxiety Difficulty Somewhat difficult Not difficult at all        11/03/2023    9:31 AM 10/13/2023   12:43 PM  Depression screen PHQ 2/9  Decreased Interest 0 0  Down, Depressed, Hopeless 0 0  PHQ - 2 Score 0 0  Altered sleeping 0 0  Tired, decreased energy 0 0  Change in appetite 0 0  Feeling bad or failure about yourself  0 0  Trouble concentrating 3 0  Moving slowly or fidgety/restless 3 0  Suicidal thoughts 0 0  PHQ-9 Score 6 0  Difficult doing work/chores Somewhat difficult Not difficult at all  Interested in counseling, does not want medication Referral placed

## 2023-11-03 NOTE — Progress Notes (Signed)
 New Patient Office Visit  Subjective:  Patient ID: Michele Castaneda, female    DOB: 08-09-1995  Age: 28 y.o. MRN: [id number]  CC:  Chief Complaint  Patient presents with   Establish Care    HPI Michele Castaneda is a 28 y.o. female  has a past medical history of Anemia, Anxiety, Asthma, and Depression.  Patient presents to establish care for her chronic medical conditions  Anemia.  Patient reports chronic anemia stated that she used to have heavy menses but not anymore.  Has regular menstrual cycle lasting 4 days with normal flow.  She denies fatigue, shortness of breath but feels cold all the time.  She was on admission in 2022 requiring blood transfusion.  Recently had labs done in July that showed anemia , was recommended to start taking iron  pills ,she has bought an over-the-counter iron  supplement but has not started taking the medication  Anxiety and depression.  Stated that she has had anxiety and depression most of her life, did therapy on and off in the past that was helpful.  Stated that her anxiety and depression is due to her childhood experiences.  Currently denies SI, HI   Past Medical History:  Diagnosis Date   Anemia    Anxiety    Asthma    Depression     History reviewed. No pertinent surgical history.  Family History  Problem Relation Age of Onset   Colon cancer Neg Hx     Social History   Socioeconomic History   Marital status: Single    Spouse name: Not on file   Number of children: Not on file   Years of education: Not on file   Highest education level: Not on file  Occupational History   Not on file  Tobacco Use   Smoking status: Never   Smokeless tobacco: Never  Vaping Use   Vaping status: Never Used  Substance and Sexual Activity   Alcohol use: Never   Drug use: Never   Sexual activity: Yes  Other Topics Concern   Not on file  Social History Narrative   Lives home alone    Social Drivers of Health   Financial Resource Strain: Not on file   Food Insecurity: Not on file  Transportation Needs: Not on file  Physical Activity: Not on file  Stress: Not on file  Social Connections: Not on file  Intimate Partner Violence: Not on file    ROS Review of Systems  Constitutional:  Negative for appetite change, chills, fatigue and fever.  HENT:  Negative for congestion, postnasal drip, rhinorrhea and sneezing.   Respiratory:  Negative for cough, shortness of breath and wheezing.   Cardiovascular:  Negative for chest pain, palpitations and leg swelling.  Gastrointestinal:  Negative for abdominal pain, constipation, nausea and vomiting.  Endocrine: Positive for cold intolerance.  Genitourinary:  Negative for difficulty urinating, dysuria, flank pain and frequency.  Musculoskeletal:  Negative for arthralgias, back pain, joint swelling and myalgias.  Skin:  Negative for color change, pallor, rash and wound.  Neurological:  Negative for dizziness, facial asymmetry, weakness, numbness and headaches.  Psychiatric/Behavioral:  Negative for behavioral problems, confusion, self-injury and suicidal ideas.     Objective:   Today's Vitals: BP 102/75   Pulse 72   Temp (!) 97.3 F (36.3 C)   Ht 5' 4 (1.626 m)   Wt 125 lb (56.7 kg)   LMP 09/28/2023   SpO2 100%   BMI 21.46 kg/m   Physical Exam Vitals  and nursing note reviewed.  Constitutional:      General: She is not in acute distress.    Appearance: Normal appearance. She is not ill-appearing, toxic-appearing or diaphoretic.  Eyes:     General: No scleral icterus.       Right eye: No discharge.        Left eye: No discharge.     Extraocular Movements: Extraocular movements intact.     Conjunctiva/sclera: Conjunctivae normal.  Cardiovascular:     Rate and Rhythm: Normal rate and regular rhythm.     Pulses: Normal pulses.     Heart sounds: Normal heart sounds. No murmur heard.    No friction rub. No gallop.  Pulmonary:     Effort: Pulmonary effort is normal. No respiratory  distress.     Breath sounds: Normal breath sounds. No stridor. No wheezing, rhonchi or rales.  Chest:     Chest wall: No tenderness.  Abdominal:     General: There is no distension.     Palpations: Abdomen is soft.     Tenderness: There is no abdominal tenderness. There is no right CVA tenderness, left CVA tenderness or guarding.  Musculoskeletal:        General: No swelling, tenderness, deformity or signs of injury.     Right lower leg: No edema.     Left lower leg: No edema.  Skin:    General: Skin is warm and dry.     Capillary Refill: Capillary refill takes less than 2 seconds.     Coloration: Skin is not jaundiced or pale.     Findings: No bruising, erythema or lesion.  Neurological:     Mental Status: She is alert and oriented to person, place, and time.     Motor: No weakness.     Coordination: Coordination normal.     Gait: Gait normal.  Psychiatric:        Mood and Affect: Mood normal.        Behavior: Behavior normal.        Thought Content: Thought content normal.        Judgment: Judgment normal.     Assessment & Plan:   Problem List Items Addressed This Visit       Other   Anemia - Primary   Lab Results  Component Value Date   WBC 4.5 10/13/2023   HGB 7.9 (L) 10/13/2023   HCT 34.4 10/13/2023   MCV 80 10/13/2023   PLT 372 10/13/2023    Lab Results  Component Value Date   IRON  12 (L) 10/13/2023   TIBC 402 10/13/2023   FERRITIN 4 (L) 10/13/2023  Take ferrous sulfate  325 mg daily, take with vitamin C or orange juice to improve absorption       Relevant Medications   ferrous sulfate  325 (65 FE) MG EC tablet   Anxiety and depression      11/03/2023    9:33 AM 10/13/2023   12:44 PM  GAD 7 : Generalized Anxiety Score  Nervous, Anxious, on Edge 3 0  Control/stop worrying 1 0  Worry too much - different things 1 0  Trouble relaxing 0 0  Restless 2 0  Easily annoyed or irritable 0 0  Afraid - awful might happen 0 0  Total GAD 7 Score 7 0  Anxiety  Difficulty Somewhat difficult Not difficult at all        11/03/2023    9:31 AM 10/13/2023   12:43 PM  Depression screen PHQ  2/9  Decreased Interest 0 0  Down, Depressed, Hopeless 0 0  PHQ - 2 Score 0 0  Altered sleeping 0 0  Tired, decreased energy 0 0  Change in appetite 0 0  Feeling bad or failure about yourself  0 0  Trouble concentrating 3 0  Moving slowly or fidgety/restless 3 0  Suicidal thoughts 0 0  PHQ-9 Score 6 0  Difficult doing work/chores Somewhat difficult Not difficult at all  Interested in counseling, does not want medication Referral placed      Relevant Orders   AMB Referral [facility] Care Management    Outpatient Encounter Medications as of 11/03/2023  Medication Sig   acetaminophen  (TYLENOL ) 500 MG tablet Take 500 mg by mouth every 6 (six) hours as needed for mild pain, fever or headache.   ferrous sulfate  325 (65 FE) MG EC tablet Take 1 tablet (325 mg total) by mouth daily.   senna-docusate (SENOKOT-S) 8.6-50 MG tablet Take 1 tablet by mouth 2 (two) times daily. (Patient not taking: Reported on 11/03/2023)   [DISCONTINUED] COVID-19 mRNA Vac-TriS, Pfizer, (PFIZER-BIONT COVID-19 VAC-TRIS) SUSP injection Inject into the muscle. (Patient not taking: Reported on 10/13/2023)   [DISCONTINUED] fluconazole (DIFLUCAN) 150 MG tablet Take 150 mg by mouth once. (Patient not taking: Reported on 10/13/2023)   [DISCONTINUED] iron  polysaccharides (NIFEREX) 150 MG capsule Take 1 capsule (150 mg total) by mouth daily. (Patient not taking: Reported on 11/03/2023)   [DISCONTINUED] metroNIDAZOLE  (FLAGYL ) 500 MG tablet Take 500 mg by mouth every 8 (eight) hours. 7 day supply (Patient not taking: Reported on 10/13/2023)   No facility-administered encounter medications on file as of 11/03/2023.    Follow-up: Return in about 4 weeks (around 12/01/2023) for anemia, CPE.   Earle Troiano R Joedy Eickhoff, FNP

## 2023-11-03 NOTE — Assessment & Plan Note (Addendum)
 Lab Results  Component Value Date   WBC 4.5 10/13/2023   HGB 7.9 (L) 10/13/2023   HCT 34.4 10/13/2023   MCV 80 10/13/2023   PLT 372 10/13/2023    Lab Results  Component Value Date   IRON  12 (L) 10/13/2023   TIBC 402 10/13/2023   FERRITIN 4 (L) 10/13/2023  Take ferrous sulfate  325 mg daily, take with vitamin C or orange juice to improve absorption

## 2023-11-03 NOTE — Patient Instructions (Addendum)
 [facility] [address] [place], [place] [address] Phone [phone]   It is important that you exercise regularly at least 30 minutes 5 times a week as tolerated  Think about what you will eat, plan ahead. Choose  clean, green, fresh or frozen over canned, processed or packaged foods which are more sugary, salty and fatty. 70 to 75% of food eaten should be vegetables and fruit. Three meals at set times with snacks allowed between meals, but they must be fruit or vegetables. Aim to eat over a 12 hour period , example 7 am to 7 pm, and STOP after  your last meal of the day. Drink water,generally about 64 ounces per day, no other drink is as healthy. Fruit juice is best enjoyed in a healthy way, by EATING the fruit.  Thanks for choosing [facility] we consider it a privelige to serve you.

## 2023-11-21 ENCOUNTER — Telehealth: Payer: Self-pay | Admitting: *Deleted

## 2023-11-21 NOTE — Progress Notes (Signed)
 Complex Care Management Note Care Guide Note  11/21/2023 Name: Michele Castaneda MRN: [id number] DOB: [birth year]   Complex Care Management Outreach Attempts: An unsuccessful telephone outreach was attempted today to offer the patient information about available complex care management services.  Follow Up Plan:  Additional outreach attempts will be made to offer the patient complex care management information and services.   Encounter Outcome:  No Answer  Harlene Satterfield  [facility]  [facility], [facility] Guide  Direct Dial: [phone]  Fax [phone]

## 2023-11-25 NOTE — Progress Notes (Unsigned)
 Complex Care Management Note Care Guide Note  11/25/2023 Name: Michele Castaneda MRN: [id number] DOB: 01-09-1996   Complex Care Management Outreach Attempts: A second unsuccessful outreach was attempted today to offer the patient with information about available complex care management services.  Follow Up Plan:  Additional outreach attempts will be made to offer the patient complex care management information and services.   Encounter Outcome:  No Answer  Harlene Satterfield  [facility]  [facility], [facility] Guide  Direct Dial: [phone]  Fax [phone]

## 2023-11-27 NOTE — Progress Notes (Signed)
 Complex Care Management Note Care Guide Note  11/27/2023 Name: Michele Castaneda MRN: [id number] DOB: Nov 10, 1995   Complex Care Management Outreach Attempts: A third unsuccessful outreach was attempted today to offer the patient with information about available complex care management services.  Follow Up Plan:  No further outreach attempts will be made at this time. We have been unable to contact the patient to offer or enroll patient in complex care management services.  Encounter Outcome:  No Answer  Harlene Satterfield  [facility]  [facility], [facility] Guide  Direct Dial: [phone]  Fax [phone]

## 2023-12-23 ENCOUNTER — Encounter: Payer: Self-pay | Admitting: Nurse Practitioner

## 2024-01-27 ENCOUNTER — Ambulatory Visit: Payer: Self-pay | Admitting: Nurse Practitioner

## 2024-01-27 VITALS — BP 112/62 | HR 90 | Wt 134.2 lb

## 2024-01-27 DIAGNOSIS — D500 Iron deficiency anemia secondary to blood loss (chronic): Secondary | ICD-10-CM

## 2024-01-27 DIAGNOSIS — J302 Other seasonal allergic rhinitis: Secondary | ICD-10-CM | POA: Diagnosis not present

## 2024-01-27 DIAGNOSIS — Z23 Encounter for immunization: Secondary | ICD-10-CM | POA: Diagnosis not present

## 2024-01-27 MED ORDER — LORATADINE 10 MG PO TABS: 10.0000 mg | ORAL_TABLET | Freq: Every day | ORAL | 11 refills | Status: AC

## 2024-01-27 MED ORDER — FERROUS SULFATE 325 (65 FE) MG PO TBEC: 325.0000 mg | DELAYED_RELEASE_TABLET | Freq: Every day | ORAL | 1 refills | Status: AC

## 2024-01-27 NOTE — Assessment & Plan Note (Signed)
 Lab Results  Component Value Date   WBC 4.5 10/13/2023   HGB 7.9 (L) 10/13/2023   HCT 34.4 10/13/2023   MCV 80 10/13/2023   PLT 372 10/13/2023    Lab Results  Component Value Date   IRON  12 (L) 10/13/2023   TIBC 402 10/13/2023   FERRITIN 4 (L) 10/13/2023    Persistent iron  deficiency anemia with hemoglobin level of 7.9 g/dL. Reports fatigue and feeling cold. Inconsistent use of iron  supplements. - Encourage consistent use of ferrous sulfate  325 mg daily with vitamin C to enhance absorption. - Order CBC to recheck hemoglobin levels. - Schedule follow-up appointment in one month to assess blood counts and improvement.

## 2024-01-27 NOTE — Progress Notes (Signed)
 .  Acute Office Visit  Subjective:     Patient ID: Michele Castaneda, female    DOB: March 11, 1996, 28 y.o.   MRN: [id number]  Chief Complaint  Patient presents with   Allergy Testing    HPI   Discussed the use of AI scribe software for clinical note transcription with the patient, who gave verbal consent to proceed.  History of Present Illness Michele Castaneda is a 28 year old female who presents for an allergy test due to suspected food and seasonal allergies.  She experiences allergic reactions to tree nuts, including almonds, cashews, and walnuts, characterized by vomiting and swelling around her mouth and gums. These symptoms occur consistently with exposure to these nuts.  She also has seasonal allergy symptoms during the winter, including puffy, itchy eyes, frequent sneezing, coughing, and a runny nose. These symptoms are similar to her springtime pollen allergies.  She recalls having allergic reactions to shrimp in her childhood, characterized by significant vomiting, but has never undergone formal allergy testing.  In addition to her allergy concerns, she has a history of iron  deficiency anemia. She was seen in August for this issue and has been prescribed ferrous sulfate  325 mg, but has inconsistent use due to forgetfulness. Her hemoglobin was 7.9 g/dL in July, and she experiences fatigue and feeling cold.  No heavy menstrual periods for the past one to two years. No shortness of breath. She confirms experiencing runny nose, sneezing, and itchy eyes.     Assessment & Plan     Review of Systems  Constitutional:  Negative for appetite change, chills, fatigue and fever.  HENT:  Positive for rhinorrhea and sneezing. Negative for congestion and postnasal drip.   Eyes:  Positive for itching.  Respiratory:  Positive for cough. Negative for shortness of breath and wheezing.   Cardiovascular:  Negative for chest pain, palpitations and leg swelling.  Gastrointestinal:  Negative for  abdominal pain, constipation, nausea and vomiting.  Genitourinary:  Negative for difficulty urinating, dysuria, flank pain and frequency.  Musculoskeletal:  Negative for arthralgias, back pain, joint swelling and myalgias.  Skin:  Negative for color change, pallor, rash and wound.  Allergic/Immunologic: Positive for food allergies.  Neurological:  Negative for dizziness, facial asymmetry, weakness, numbness and headaches.  Psychiatric/Behavioral:  Negative for behavioral problems, confusion, self-injury and suicidal ideas.         Objective:    BP 112/62 (BP Location: Right Arm, Patient Position: Sitting, Cuff Size: Normal)   Pulse 90   Wt 134 lb 3.2 oz (60.9 kg)   SpO2 100%   BMI 23.04 kg/m    Physical Exam Vitals and nursing note reviewed.  Constitutional:      General: She is not in acute distress.    Appearance: Normal appearance. She is not ill-appearing, toxic-appearing or diaphoretic.  HENT:     Mouth/Throat:     Mouth: Mucous membranes are moist.     Pharynx: Oropharynx is clear. No oropharyngeal exudate or posterior oropharyngeal erythema.  Eyes:     General: No scleral icterus.       Right eye: No discharge.        Left eye: No discharge.     Extraocular Movements: Extraocular movements intact.     Conjunctiva/sclera: Conjunctivae normal.  Cardiovascular:     Rate and Rhythm: Normal rate and regular rhythm.     Pulses: Normal pulses.     Heart sounds: Normal heart sounds. No murmur heard.    No friction rub. No  gallop.  Pulmonary:     Effort: Pulmonary effort is normal. No respiratory distress.     Breath sounds: Normal breath sounds. No stridor. No wheezing, rhonchi or rales.  Chest:     Chest wall: No tenderness.  Abdominal:     General: There is no distension.     Palpations: Abdomen is soft.     Tenderness: There is no abdominal tenderness. There is no right CVA tenderness, left CVA tenderness or guarding.  Musculoskeletal:        General: No swelling,  tenderness, deformity or signs of injury.     Right lower leg: No edema.     Left lower leg: No edema.  Skin:    General: Skin is warm and dry.     Capillary Refill: Capillary refill takes less than 2 seconds.     Coloration: Skin is not jaundiced or pale.     Findings: No bruising, erythema or lesion.  Neurological:     Mental Status: She is alert and oriented to person, place, and time.     Motor: No weakness.     Gait: Gait normal.  Psychiatric:        Mood and Affect: Mood normal.        Behavior: Behavior normal.        Thought Content: Thought content normal.        Judgment: Judgment normal.     No results found for any visits on 01/27/24.      Assessment & Plan:   Problem List Items Addressed This Visit       Respiratory   Seasonal allergic rhinitis   Experiencing seasonal allergy symptoms suggestive of allergic rhinitis. - Prescribe Claritin 10 mg daily for allergy symptoms. Possible food allergy (tree nuts, shellfish) Reports allergic reactions to tree nuts and shellfish with symptoms of vomiting and oral swelling. No prior allergy testing conducted. - Refer to allergist for allergy testing. - Provide allergist's contact information for scheduling if not contacted within 1-2 weeks.         Relevant Medications   loratadine (CLARITIN) 10 MG tablet   Other Relevant Orders   Ambulatory referral to Allergy     Other   Anemia - Primary   Lab Results  Component Value Date   WBC 4.5 10/13/2023   HGB 7.9 (L) 10/13/2023   HCT 34.4 10/13/2023   MCV 80 10/13/2023   PLT 372 10/13/2023    Lab Results  Component Value Date   IRON  12 (L) 10/13/2023   TIBC 402 10/13/2023   FERRITIN 4 (L) 10/13/2023    Persistent iron  deficiency anemia with hemoglobin level of 7.9 g/dL. Reports fatigue and feeling cold. Inconsistent use of iron  supplements. - Encourage consistent use of ferrous sulfate  325 mg daily with vitamin C to enhance absorption. - Order CBC to recheck  hemoglobin levels. - Schedule follow-up appointment in one month to assess blood counts and improvement.       Relevant Medications   ferrous sulfate  325 (65 FE) MG EC tablet   Other Relevant Orders   CBC   Iron , TIBC and Ferritin Panel   Vitamin B12   Need for influenza vaccination    Meds ordered this encounter  Medications   loratadine (CLARITIN) 10 MG tablet    Sig: Take 1 tablet (10 mg total) by mouth daily.    Dispense:  30 tablet    Refill:  11   ferrous sulfate  325 (65 FE) MG EC tablet  Sig: Take 1 tablet (325 mg total) by mouth daily.    Dispense:  90 tablet    Refill:  1    Return in about 4 weeks (around 02/24/2024), or anemia.  Michele Castaneda R Carsyn Boster, FNP

## 2024-01-27 NOTE — Patient Instructions (Addendum)
 Please take ferrous sulfate  325 mg daily, take with vitamin C to improve absorption  You have been referred to the allergist please call [phone] in 1 to 2 weeks  1. Iron  deficiency anemia due to chronic blood loss (Primary)  - CBC - Iron , TIBC and Ferritin Panel - Vitamin B12 - ferrous sulfate  325 (65 FE) MG EC tablet; Take 1 tablet (325 mg total) by mouth daily.  Dispense: 90 tablet; Refill: 1  2. Allergic rhinitis, unspecified seasonality, unspecified trigger  - Ambulatory referral to Allergy - loratadine (CLARITIN) 10 MG tablet; Take 1 tablet (10 mg total) by mouth daily.  Dispense: 30 tablet; Refill: 11  3. Need for influenza vaccination  It is important that you exercise regularly at least 30 minutes 5 times a week as tolerated  Think about what you will eat, plan ahead. Choose  clean, green, fresh or frozen over canned, processed or packaged foods which are more sugary, salty and fatty. 70 to 75% of food eaten should be vegetables and fruit. Three meals at set times with snacks allowed between meals, but they must be fruit or vegetables. Aim to eat over a 12 hour period , example 7 am to 7 pm, and STOP after  your last meal of the day. Drink water,generally about 64 ounces per day, no other drink is as healthy. Fruit juice is best enjoyed in a healthy way, by EATING the fruit.  Thanks for choosing [facility] we consider it a privelige to serve you.

## 2024-01-27 NOTE — Assessment & Plan Note (Signed)
 Experiencing seasonal allergy symptoms suggestive of allergic rhinitis. - Prescribe Claritin 10 mg daily for allergy symptoms. Possible food allergy (tree nuts, shellfish) Reports allergic reactions to tree nuts and shellfish with symptoms of vomiting and oral swelling. No prior allergy testing conducted. - Refer to allergist for allergy testing. - Provide allergist's contact information for scheduling if not contacted within 1-2 weeks.

## 2024-01-28 ENCOUNTER — Ambulatory Visit: Payer: Self-pay | Admitting: Nurse Practitioner

## 2024-01-28 LAB — CBC
Hematocrit: 27.1 % — ABNORMAL LOW (ref 34.0–46.6)
Hemoglobin: 6.8 g/dL — CL (ref 11.1–15.9)
MCH: 15.3 pg — ABNORMAL LOW (ref 26.6–33.0)
MCHC: 25.1 g/dL — ABNORMAL LOW (ref 31.5–35.7)
MCV: 61 fL — ABNORMAL LOW (ref 79–97)
Platelets: 377 x10E3/uL (ref 150–450)
RBC: 4.44 x10E6/uL (ref 3.77–5.28)
RDW: 20.3 % — ABNORMAL HIGH (ref 11.7–15.4)
WBC: 5.9 x10E3/uL (ref 3.4–10.8)

## 2024-01-28 LAB — IRON,TIBC AND FERRITIN PANEL
Ferritin: 3 ng/mL — ABNORMAL LOW (ref 15–150)
Iron Saturation: 2 % — CL (ref 15–55)
Iron: 11 ug/dL — ABNORMAL LOW (ref 27–159)
Total Iron Binding Capacity: 454 ug/dL — ABNORMAL HIGH (ref 250–450)
UIBC: 443 ug/dL — ABNORMAL HIGH (ref 131–425)

## 2024-01-28 LAB — VITAMIN B12: Vitamin B-12: 549 pg/mL (ref 232–1245)

## 2024-01-30 DIAGNOSIS — Z23 Encounter for immunization: Secondary | ICD-10-CM | POA: Diagnosis not present

## 2024-01-30 NOTE — Addendum Note (Signed)
 Addended by: VICTORY IHA on: 01/30/2024 02:07 PM   Modules accepted: Orders

## 2024-03-05 ENCOUNTER — Ambulatory Visit: Payer: Self-pay | Admitting: Nurse Practitioner

## 2024-03-24 ENCOUNTER — Ambulatory Visit: Admitting: Nurse Practitioner

## 2024-04-09 ENCOUNTER — Encounter: Payer: Self-pay | Admitting: Internal Medicine

## 2024-04-09 ENCOUNTER — Ambulatory Visit: Admitting: Internal Medicine

## 2024-04-09 ENCOUNTER — Other Ambulatory Visit: Payer: Self-pay

## 2024-04-09 VITALS — BP 90/70 | HR 83 | Temp 98.0°F | Ht 64.57 in | Wt 134.7 lb

## 2024-04-09 DIAGNOSIS — J393 Upper respiratory tract hypersensitivity reaction, site unspecified: Secondary | ICD-10-CM

## 2024-04-09 DIAGNOSIS — D508 Other iron deficiency anemias: Secondary | ICD-10-CM

## 2024-04-09 DIAGNOSIS — J3089 Other allergic rhinitis: Secondary | ICD-10-CM | POA: Diagnosis not present

## 2024-04-09 DIAGNOSIS — R053 Chronic cough: Secondary | ICD-10-CM | POA: Diagnosis not present

## 2024-04-09 MED ORDER — CETIRIZINE HCL 10 MG PO TABS: 10.0000 mg | ORAL_TABLET | Freq: Every day | ORAL | 5 refills | Status: DC | PRN

## 2024-04-09 NOTE — Progress Notes (Signed)
 "  NEW PATIENT  Date of Service/Encounter:  04/09/2024  Consult requested by: Paseda, Folashade R, FNP   Subjective:   Michele Castaneda (DOB: 1995-05-25) is a 29 y.o. female who presents to the clinic on 04/09/2024 with a chief complaint of Nasal Congestion (Watery eyes sneezing runny nose), Allergic Reaction (Foods unknown), Sinus Problem, Angioedema (C/o face swelling), and Breathing Problem .    History obtained from: chart review and patient.   Rhinitis:  Ongoing for many years but worse in the last 2 years. Symptoms include: rhinorrhea and post nasal drainage, ear fullness, watery eyes, puffiness around the eyes, sneezing, coughing Occurs seasonally-Fall/Winter  Potential triggers: not sure  Treatments tried:  Benadryl in the past  Previous allergy testing: no History of sinus surgery: no Nonallergic triggers: none   Food Reactions: Cashews, walnuts, pecan causes vomiting, mouth swelling, mouth tingling Shellfish causes vomiting  Almond/peanut she is fine with it  Asthma: Diagnosed at a young age. Has not needed an inhaler since she was 14.  She does have episodes of shortness of breath and feels fatigue/wheezy with activity.   Also notes history of IDA, last Hgb was 6.8.  Taking oral iron . Was supposed to have a recheck with PCP but missed the appointment. Has not seen Hematology.  Notes prior hx of requiring blood transfusions.   Reviewed:  01/27/2024: seen by Juanice NP for allergic rhinitis, Rx Claritin .  Also concern for food allergies- treenut/shellfish.   01/27/2024: Hgb 6.8  06/04/2020: admitted for symptomatic microcytic anemia, likely due to menorrhagia, given 2 units pRBC and d/c home with iron  supplement and PCP/obgyn follow up.   Past Medical History: Past Medical History:  Diagnosis Date   Anemia    Anxiety    Asthma    Depression     Past Surgical History: History reviewed. No pertinent surgical history.  Family History: Family History  Problem  Relation Age of Onset   Allergic rhinitis Mother    Eczema Sister    Colon cancer Neg Hx     Social History:  Flooring in bedroom: carpet Pets: dog Tobacco use/exposure: none Job: server   Medication List:  Allergies as of 04/09/2024   No Known Allergies      Medication List        Accurate as of April 09, 2024  3:15 PM. If you have any questions, ask your nurse or doctor.          acetaminophen  500 MG tablet Commonly known as: TYLENOL  Take 500 mg by mouth every 6 (six) hours as needed for mild pain, fever or headache.   ferrous sulfate  325 (65 FE) MG EC tablet Take 1 tablet (325 mg total) by mouth daily.   loratadine  10 MG tablet Commonly known as: CLARITIN  Take 1 tablet (10 mg total) by mouth daily.   senna-docusate 8.6-50 MG tablet Commonly known as: Senokot-S Take 1 tablet by mouth 2 (two) times daily.         REVIEW OF SYSTEMS: Pertinent positives and negatives discussed in HPI.   Objective:   Physical Exam: BP 90/70   Pulse 83   Temp 98 F (36.7 C)   Ht 5' 4.57 (1.64 m)   Wt 134 lb 11.2 oz (61.1 kg)   SpO2 100%   BMI 22.72 kg/m  Body mass index is 22.72 kg/m. GEN: alert, well developed HEENT: clear conjunctiva, nose with + mild inferior turbinate hypertrophy, boggy nasal mucosa, +clear rhinorrhea, + cobblestoning HEART: regular rate and rhythm, no murmur  LUNGS: clear to auscultation bilaterally, no coughing, unlabored respiration ABDOMEN: soft, non distended  SKIN: no rashes or lesions  Spirometry:  Tracings reviewed. Her effort: Good reproducible efforts. FVC: 3.01L, 92% predicted FEV1: 2.51L, 89% predicted FEV1/FVC ratio: 83% Interpretation: Spirometry consistent with normal pattern.  Please see scanned spirometry results for details.  Assessment:   1. Other allergic rhinitis   2. Other iron  deficiency anemia   3. Upper respiratory tract hypersensitivity reaction   4. Chronic cough     Plan/Recommendations:  Other  Allergic Rhinitis: - Due to turbinate hypertrophy, seasonal symptoms and unresponsive to over the counter meds, will perform skin testing to identify aeroallergen triggers.   - Use nasal saline rinses before nose sprays such as with Neilmed Sinus Rinse.  Use distilled water.   - Use Zyrtec  10 mg daily as needed for runny nose, sneezing, itchy watery eyes.   Food Reactions - For now, strictly avoid treenuts (except almond) and shellfish.  - Initial rxn: cashew/walnut/pecans cause vomiting/mouth swelling and tingling; shellfish cause vomiting and mouth tingling; does fine with almonds  History of Asthma - Normal spirometry today. Likely outgrown as she has not required Albuterol/asthma medications since age 16. I do wonder if current symptoms (dyspnea/sounding wheeze with activity) are related to your severe anemia.   Iron  Deficiency Anemia - Will check H/H today.  Will refer to Hematology.  Last Hgb was 6.8 on 12/2023. Discussed if she has palpitations, dizziness, chest pain, trouble breathing, needs to go to the ER.  Hold all anti-histamines (Xyzal, Allegra, Zyrtec , Claritin , Benadryl, Pepcid) 3 days prior to next visit.  Follow up: 1/21 at 830 for skin testing 1-55, shellfish individuals + mix, treenuts   Arleta Blanch, MD [facility]        "

## 2024-04-09 NOTE — Patient Instructions (Addendum)
 Other Allergic Rhinitis: - Use nasal saline rinses before nose sprays such as with Neilmed Sinus Rinse.  Use distilled water.   - Use Zyrtec  10 mg daily as needed for runny nose, sneezing, itchy watery eyes.   Food Reactions - Initial rxn: cashew/walnut/pecans cause vomiting/mouth swelling and tingling; shellfish cause vomiting   History of Asthma - Likely outgrown. I do wonder if current symptoms (dyspnea/sounding wheeze with activity) are related to your severe anemia.   Iron  Deficiency Anemia - Will check H/H today.  Will refer to Hematology if low.   Hold all anti-histamines (Xyzal, Allegra, Zyrtec , Claritin , Benadryl, Pepcid) 3 days prior to next visit.  Follow up: 1/21 at 830 for skin testing 1-55, shellfish individuals + mix, treenuts

## 2024-04-10 LAB — CBC WITH DIFFERENTIAL/PLATELET
Basophils Absolute: 0.1 x10E3/uL (ref 0.0–0.2)
Basos: 1 %
EOS (ABSOLUTE): 0.3 x10E3/uL (ref 0.0–0.4)
Eos: 5 %
Hematocrit: 34.1 % (ref 34.0–46.6)
Hemoglobin: 9.2 g/dL — ABNORMAL LOW (ref 11.1–15.9)
Immature Grans (Abs): 0 x10E3/uL (ref 0.0–0.1)
Immature Granulocytes: 0 %
Lymphocytes Absolute: 2 x10E3/uL (ref 0.7–3.1)
Lymphs: 35 %
MCH: 19.3 pg — ABNORMAL LOW (ref 26.6–33.0)
MCHC: 27 g/dL — ABNORMAL LOW (ref 31.5–35.7)
MCV: 72 fL — ABNORMAL LOW (ref 79–97)
Monocytes Absolute: 0.6 x10E3/uL (ref 0.1–0.9)
Monocytes: 10 %
Neutrophils Absolute: 2.9 x10E3/uL (ref 1.4–7.0)
Neutrophils: 49 %
Platelets: 365 x10E3/uL (ref 150–450)
RBC: 4.76 x10E6/uL (ref 3.77–5.28)
RDW: 19.3 % — ABNORMAL HIGH (ref 11.7–15.4)
WBC: 5.9 x10E3/uL (ref 3.4–10.8)

## 2024-04-12 ENCOUNTER — Ambulatory Visit: Payer: Self-pay | Admitting: Internal Medicine

## 2024-04-22 ENCOUNTER — Ambulatory Visit: Admitting: Allergy & Immunology

## 2024-04-22 DIAGNOSIS — T7805XD Anaphylactic reaction due to tree nuts and seeds, subsequent encounter: Secondary | ICD-10-CM

## 2024-04-22 DIAGNOSIS — T7802XD Anaphylactic reaction due to shellfish (crustaceans), subsequent encounter: Secondary | ICD-10-CM | POA: Diagnosis not present

## 2024-04-22 DIAGNOSIS — J302 Other seasonal allergic rhinitis: Secondary | ICD-10-CM | POA: Diagnosis not present

## 2024-04-22 DIAGNOSIS — J3089 Other allergic rhinitis: Secondary | ICD-10-CM

## 2024-04-22 MED ORDER — FLUTICASONE PROPIONATE 50 MCG/ACT NA SUSP: 2.0000 | Freq: Every day | NASAL | 5 refills | Status: AC

## 2024-04-22 MED ORDER — CETIRIZINE HCL 10 MG PO TABS: 10.0000 mg | ORAL_TABLET | Freq: Every day | ORAL | 1 refills | Status: AC

## 2024-04-22 MED ORDER — EPINEPHRINE 0.3 MG/0.3ML IJ SOAJ: 0.3000 mg | Freq: Once | INTRAMUSCULAR | 2 refills | Status: AC

## 2024-04-22 NOTE — Progress Notes (Unsigned)
 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 Seasonal and perennial allergic rhinitis - Testing today showed: grasses, ragweed, weeds, trees, dust mites, cat, dog, and cockroach - Copy of test results provided.  - Avoidance measures provided. - Start taking: Zyrtec  (cetirizine ) 10mg  tablet once daily and Flonase  (fluticasone ) one spray per nostril daily (AIM FOR EAR ON EACH SIDE) - You can use an extra dose of the antihistamine, if needed, for breakthrough symptoms.  - Consider nasal saline rinses 1-2 times daily to remove allergens from the nasal cavities as well as help with mucous clearance (this is especially helpful to do before the nasal sprays are given) - Consider allergy  shots as a means of long-term control. - Allergy  shots re-train and reset the immune system to ignore environmental allergens and decrease the resulting immune response to those allergens (sneezing, itchy watery eyes, runny nose, nasal congestion, etc).    - Allergy  shots improve symptoms in 75-85% of patients.  - We can discuss more at the next appointment if the medications are not working for you.  2. 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 - I would avoid these for now. - EpiPen  training provided. - Emergency Action Plan provided.  3. Return in about 3 months (around 07/21/2024). You can have the follow up appointment with Dr. Iva or a Nurse Practicioner (our Nurse Practitioners are excellent and always have Physician oversight!).    Subjective:   Michele Castaneda is a 29 y.o. female presenting today for follow up of  Chief Complaint  Patient presents with   Allergy  Testing    1-55 and select foods    Aden Ertle has a  history of the following: Patient Active Problem List   Diagnosis Date Noted   Seasonal allergic rhinitis 01/27/2024   Need for influenza vaccination 01/27/2024   Anxiety and depression 11/03/2023   Anemia 06/05/2020   Symptomatic anemia 06/04/2020   Menorrhagia 06/04/2020   Abdominal pain     History obtained from: chart review and patient.  Discussed the use of AI scribe software for clinical note transcription with the patient and/or guardian, who gave verbal consent to proceed.  Michele Castaneda is a 29 y.o. female presenting for skin testing. She was last seen on January 6th. We could not do testing because her insurance company does not cover testing on the same day as a New Patient visit. She has been off of all antihistamines 3 days in anticipation of the testing.   She has a history of allergic reactions to various environmental allergens, tree nuts, and shellfish. Her symptoms are particularly significant during the winter. She works at a daycare and occasionally works outside, potentially increasing her exposure to allergens.  She has a known allergy  to cashews, which causes immediate symptoms such as itching of the gums, swollen lips, and profuse vomiting. She recalls a severe reaction after consuming cashew cheese and also reports a reaction to a granola bar containing tree nuts, which caused itching and nausea.  She has experienced vomiting after consuming shellfish, specifically shrimp and lobster, since childhood. She has avoided these foods since experiencing reactions.  She has not been using antihistamines regularly but has used Benadryl in the past, which makes her tired and  helps her sleep through the night. She has not used Zyrtec  or other daily antihistamines recently. She does not have an EpiPen  and this is her first time undergoing allergy  testing. She has not had an asthma attack since childhood but experiences wheezing when her allergies are exacerbated.  Her family history  is significant for allergies, with her sister being allergic to pine nuts and dogs, another sister to tree nuts and eggs, and a brother to tree nuts and eggs as well.    Otherwise, there have been no changes to her past medical history, surgical history, family history, or social history.    Review of systems otherwise negative other than that mentioned in the HPI.    Objective:   There were no vitals taken for this visit. There is no height or weight on file to calculate BMI.    Physical exam deferred since this was a skin testing appointment only.   Diagnostic studies:   Allergy  Studies:     Airborne Adult Perc - 04/22/24 1432     Time Antigen Placed 1432    Allergen Manufacturer Jestine    Location Back    Number of Test 55    1. Control-Buffer 50% Glycerol Negative    2. Control-Histamine 2+    3. Bahia Negative    4. Bermuda Negative    5. Johnson Negative    6. Kentucky  Blue 3+    7. Meadow Fescue 3+    8. Perennial Rye 3+    9. Timothy Negative    10. Ragweed Mix Negative    11. Cocklebur Negative    12. Plantain,  English Negative    13. Baccharis 2+    14. Dog Fennel Negative    15. Russian Thistle Negative    16. Lamb's Quarters Negative    17. Sheep Sorrell Negative    18. Rough Pigweed Negative    19. Marsh Elder, Rough Negative    20. Mugwort, Common Negative    21. Box, Elder Negative    22. Cedar, red Negative    23. Sweet Gum Negative    24. Pecan Pollen 4+    25. Pine Mix Negative    26. Walnut, Black Pollen 3+    27. Red Mulberry Negative    28. Ash Mix Negative    29. Birch Mix 2+    30. Beech American Negative    31. Cottonwood, Eastern Negative    32. Hickory, White 4+    33. Maple Mix Negative    34. Oak, Eastern Mix Negative    35. Sycamore Eastern Negative    36. Alternaria Alternata Negative    37. Cladosporium Herbarum Negative    38. Aspergillus Mix Negative    39. Penicillium Mix Negative    40. Bipolaris Sorokiniana  (Helminthosporium) Negative    41. Drechslera Spicifera (Curvularia) Negative    42. Mucor Plumbeus Negative    43. Fusarium Moniliforme Negative    44. Aureobasidium Pullulans (pullulara) Negative    45. Rhizopus Oryzae Negative    46. Botrytis Cinera Negative    47. Epicoccum Nigrum Negative    48. Phoma Betae Negative    49. Dust Mite Mix 4+    50. Cat Hair 10,000 BAU/ml 4+    51.  Dog Epithelia Negative    52. Mixed Feathers Negative    53. Horse Epithelia Negative    54. Cockroach, German Negative    55. Tobacco Leaf Negative  Intradermal - 04/22/24 1453     Time Antigen Placed 1500    Allergen Manufacturer Jestine    Location Arm    Number of Test 12    Control Negative    Bahia 2+    Bermuda 2+    Johnson 2+    Ragweed Mix 3+    Weed Mix 3+    Mold 1 Negative    Mold 2 Negative    Mold 3 Negative    Mold 4 Negative    Dog 2+    Cockroach 3+          Food Adult Perc - 04/22/24 1400     Time Antigen Placed 1432    Allergen Manufacturer Greer    Location Back    Number of allergen test 16     Control-buffer 50% Glycerol Negative    Control-Histamine 2+    8. Shellfish Mix Negative    10. Cashew --   15 x 35   11. Walnut Food --   5 x 7   12. Almond Negative    13. Hazelnut --   3 x 9   14. Pecan Food Negative    15. Pistachio --   15 x 19   16. Brazil Nut Negative    17. Coconut Negative    23. Shrimp --   2 x 4   24. Crab Negative    25. Lobster --   5 x 7   26. Oyster Negative    27. Scallops Negative          Allergy  testing results were read and interpreted by myself, documented by clinical staff.      Marty Shaggy, MD  [facility]        "

## 2024-04-22 NOTE — Patient Instructions (Addendum)
 1. Seasonal and perennial allergic rhinitis (Primary) - Testing today showed: grasses, ragweed, weeds, trees, dust mites, cat, dog, and cockroach - Copy of test results provided.  - Avoidance measures provided. - Start taking: Zyrtec  (cetirizine ) 10mg  tablet once daily and Flonase  (fluticasone ) one spray per nostril daily (AIM FOR EAR ON EACH SIDE) - You can use an extra dose of the antihistamine, if needed, for breakthrough symptoms.  - Consider nasal saline rinses 1-2 times daily to remove allergens from the nasal cavities as well as help with mucous clearance (this is especially helpful to do before the nasal sprays are given) - Consider allergy  shots as a means of long-term control. - Allergy  shots re-train and reset the immune system to ignore environmental allergens and decrease the resulting immune response to those allergens (sneezing, itchy watery eyes, runny nose, nasal congestion, etc).    - Allergy  shots improve symptoms in 75-85% of patients.  - We can discuss more at the next appointment if the medications are not working for you.  2. 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 - I would avoid these for now. - EpiPen  training provided. - Emergency Action Plan provided.  3. Return in about 3 months (around 07/21/2024). You can have the follow up appointment with Dr. Iva or a Nurse Practicioner (our Nurse Practitioners are excellent and always have Physician oversight!).    Please inform us  of any Emergency Department visits, hospitalizations, or changes in symptoms. Call us  before going to the ED for breathing or allergy  symptoms since we might be able to fit you in for a sick visit. Feel free to contact us  anytime with any questions, problems, or concerns.  It was a pleasure to meet you today!  Websites that have reliable patient information: 1. American Academy of Asthma, Allergy , and Immunology:  www.aaaai.org 2. Food Allergy  Research and Education (FARE): foodallergy.org 3. Mothers of Asthmatics: http://www.asthmacommunitynetwork.org 4. American College of Allergy , Asthma, and Immunology: www.acaai.org      Like us  on [employer] and Instagram for our latest updates!      A healthy democracy works best when [employer] participate! Make sure you are registered to vote! If you have moved or changed any of your contact information, you will need to get this updated before voting! Scan the QR codes below to learn more!       Airborne Adult Perc - 04/22/24 1432     Time Antigen Placed 1432    Allergen Manufacturer Jestine    Location Back    Number of Test 55    1. Control-Buffer 50% Glycerol Negative    2. Control-Histamine 2+    3. Bahia Negative    4. Bermuda Negative    5. Johnson Negative    6. [place]  Blue 3+    7. Meadow Fescue 3+    8. Perennial Rye 3+    9. Timothy Negative    10. Ragweed Mix Negative    11. Cocklebur Negative    12. Plantain,  English Negative    13. Baccharis 2+    14. Dog Fennel Negative    15. Russian Thistle Negative    16. Lamb's Quarters Negative    17. Sheep Sorrell Negative    18. Rough Pigweed Negative    19. Marsh Elder, Rough Negative    20. Mugwort, Common Negative    21. Box, Elder Negative    22. Cedar, red Negative  23. Sweet Gum Negative    24. Pecan Pollen 4+    25. Pine Mix Negative    26. Walnut, Black Pollen 3+    27. Red Mulberry Negative    28. Ash Mix Negative    29. Birch Mix 2+    30. Beech American Negative    31. Cottonwood, Eastern Negative    32. Hickory, White 4+    33. Maple Mix Negative    34. Oak, Eastern Mix Negative    35. Sycamore Eastern Negative    36. Alternaria Alternata Negative    37. Cladosporium Herbarum Negative    38. Aspergillus Mix Negative    39. Penicillium Mix Negative    40. Bipolaris Sorokiniana (Helminthosporium) Negative    41. Drechslera Spicifera (Curvularia)  Negative    42. Mucor Plumbeus Negative    43. Fusarium Moniliforme Negative    44. Aureobasidium Pullulans (pullulara) Negative    45. Rhizopus Oryzae Negative    46. Botrytis Cinera Negative    47. Epicoccum Nigrum Negative    48. Phoma Betae Negative    49. Dust Mite Mix 4+    50. Cat Hair 10,000 BAU/ml 4+    51.  Dog Epithelia Negative    52. Mixed Feathers Negative    53. Horse Epithelia Negative    54. Cockroach, German Negative    55. Tobacco Leaf Negative          Intradermal - 04/22/24 1453     Time Antigen Placed 1500    Allergen Manufacturer Jestine    Location Arm    Number of Test 12    Control Negative    Bahia 2+    Bermuda 2+    Johnson 2+    Ragweed Mix 3+    Weed Mix 3+    Mold 1 Negative    Mold 2 Negative    Mold 3 Negative    Mold 4 Negative    Dog 2+    Cockroach 3+          Food Adult Perc - 04/22/24 1400     Time Antigen Placed 1432    Allergen Manufacturer Greer    Location Back    Number of allergen test 16     Control-buffer 50% Glycerol Negative    Control-Histamine 2+    8. Shellfish Mix Negative    10. Cashew --   15 x 35   11. Walnut Food --   5 x 7   12. Almond Negative    13. Hazelnut --   3 x 9   14. Pecan Food Negative    15. Pistachio --   15 x 19   16. Brazil Nut Negative    17. Coconut Negative    23. Shrimp --   2 x 4   24. Crab Negative    25. Lobster --   5 x 7   26. Oyster Negative    27. Scallops Negative          Reducing Pollen Exposure  The American Academy of Allergy , Asthma and Immunology suggests the following steps to reduce your exposure to pollen during allergy  seasons.    Do not hang sheets or clothing out to dry; pollen may collect on these items. Do not mow lawns or spend time around freshly cut grass; mowing stirs up pollen. Keep windows closed at night.  Keep car windows closed while driving. Minimize morning activities outdoors, a time when pollen counts are usually  at their  highest. Stay indoors as much as possible when pollen counts or humidity is high and on windy days when pollen tends to remain in the air longer. Use air conditioning when possible.  Many air conditioners have filters that trap the pollen spores. Use a HEPA room air filter to remove pollen form the indoor air you breathe.  Control of Dust Mite Allergen    Dust mites play a major role in allergic asthma and rhinitis.  They occur in environments with high humidity wherever human skin is found.  Dust mites absorb humidity from the atmosphere (ie, they do not drink) and feed on organic matter (including shed human and animal skin).  Dust mites are a microscopic type of insect that you cannot see with the naked eye.  High levels of dust mites have been detected from mattresses, pillows, carpets, upholstered furniture, bed covers, clothes, soft toys and any woven material.  The principal allergen of the dust mite is found in its feces.  A gram of dust may contain 1,000 mites and 250,000 fecal particles.  Mite antigen is easily measured in the air during house cleaning activities.  Dust mites do not bite and do not cause harm to humans, other than by triggering allergies/asthma.    Ways to decrease your exposure to dust mites in your home:  Encase mattresses, box springs and pillows with a mite-impermeable barrier or cover   Wash sheets, blankets and drapes weekly in hot water (130 F) with detergent and dry them in a dryer on the hot setting.  Have the room cleaned frequently with a vacuum cleaner and a damp dust-mop.  For carpeting or rugs, vacuuming with a vacuum cleaner equipped with a high-efficiency particulate air (HEPA) filter.  The dust mite allergic individual should not be in a room which is being cleaned and should wait 1 hour after cleaning before going into the room. Do not sleep on upholstered furniture (eg, couches).   If possible removing carpeting, upholstered furniture and drapery from  the home is ideal.  Horizontal blinds should be eliminated in the rooms where the person spends the most time (bedroom, study, television room).  Washable vinyl, roller-type shades are optimal. Remove all non-washable stuffed toys from the bedroom.  Wash stuffed toys weekly like sheets and blankets above.   Reduce indoor humidity to less than 50%.  Inexpensive humidity monitors can be purchased at most hardware stores.  Do not use a humidifier as can make the problem worse and are not recommended.  Control of Dog or Cat Allergen  Avoidance is the best way to manage a dog or cat allergy . If you have a dog or cat and are allergic to dog or cats, consider removing the dog or cat from the home. If you have a dog or cat but dont want to find it a new home, or if your family wants a pet even though someone in the household is allergic, here are some strategies that may help keep symptoms at bay:  Keep the pet out of your bedroom and restrict it to only a few rooms. Be advised that keeping the dog or cat in only one room will not limit the allergens to that room. Dont pet, hug or kiss the dog or cat; if you do, wash your hands with soap and water. High-efficiency particulate air (HEPA) cleaners run continuously in a bedroom or living room can reduce allergen levels over time. Regular use of a high-efficiency vacuum cleaner or  a central vacuum can reduce allergen levels. Giving your dog or cat a bath at least once a week can reduce airborne allergen.  Control of Cockroach Allergen  Cockroach allergen has been identified as an important cause of acute attacks of asthma, especially in urban settings.  There are fifty-five species of cockroach that exist in the [place] , however only three, the American, German and Oriental species produce allergen that can affect patients with Asthma.  Allergens can be obtained from fecal particles, egg casings and secretions from cockroaches.    Remove food  sources. Reduce access to water. Seal access and entry points. Spray runways with 0.5-1% Diazinon or Chlorpyrifos Blow boric acid power under stoves and refrigerator. Place bait stations (hydramethylnon) at feeding sites.  Allergy  Shots  Allergies are the result of a chain reaction that starts in the immune system. Your immune system controls how your body defends itself. For instance, if you have an allergy  to pollen, your immune system identifies pollen as an invader or allergen. Your immune system overreacts by producing antibodies called Immunoglobulin E (IgE). These antibodies travel to cells that release chemicals, causing an allergic reaction.  The concept behind allergy  immunotherapy, whether it is received in the form of shots or tablets, is that the immune system can be desensitized to specific allergens that trigger allergy  symptoms. Although it requires time and patience, the payback can be long-term relief. Allergy  injections contain a dilute solution of those substances that you are allergic to based upon your skin testing and allergy  history.   How Do Allergy  Shots Work?  Allergy  shots work much like a vaccine. Your body responds to injected amounts of a particular allergen given in increasing doses, eventually developing a resistance and tolerance to it. Allergy  shots can lead to decreased, minimal or no allergy  symptoms.  There generally are two phases: build-up and maintenance. Build-up often ranges from three to six months and involves receiving injections with increasing amounts of the allergens. The shots are typically given once or twice a week, though more rapid build-up schedules are sometimes used.  The maintenance phase begins when the most effective dose is reached. This dose is different for each person, depending on how allergic you are and your response to the build-up injections. Once the maintenance dose is reached, there are longer periods between injections,  typically two to four weeks.  Occasionally doctors give cortisone-type shots that can temporarily reduce allergy  symptoms. These types of shots are different and should not be confused with allergy  immunotherapy shots.  Who Can Be Treated with Allergy  Shots?  Allergy  shots may be a good treatment approach for people with allergic rhinitis (hay fever), allergic asthma, conjunctivitis (eye allergy ) or stinging insect allergy .   Before deciding to begin allergy  shots, you should consider:   The length of allergy  season and the severity of your symptoms  Whether medications and/or changes to your environment can control your symptoms  Your desire to avoid long-term medication use  Time: allergy  immunotherapy requires a major time commitment  Cost: may vary depending on your insurance coverage  Allergy  shots for children age 50 and older are effective and often well tolerated. They might prevent the onset of new allergen sensitivities or the progression to asthma.  Allergy  shots are not started on patients who are pregnant but can be continued on patients who become pregnant while receiving them. In some patients with other medical conditions or who take certain common medications, allergy  shots may be of risk.  It is important to mention other medications you talk to your allergist.   What are the two types of build-ups offered:   RUSH or Rapid Desensitization -- one day of injections lasting from 8:30-4:30pm, injections every 1 hour.  Approximately half of the build-up process is completed in that one day.  The following week, normal build-up is resumed, and this entails ~16 visits either weekly or twice weekly, until reaching your maintenance dose which is continued weekly until eventually getting spaced out to every month for a duration of 3 to 5 years. The regular build-up appointments are nurse visits where the injections are administered, followed by required monitoring for 30 minutes.     Traditional build-up -- weekly visits for 6 -12 months until reaching maintenance dose, then continue weekly until eventually spacing out to every 4 weeks as above. At these appointments, the injections are administered, followed by required monitoring for 30 minutes.     Either way is acceptable, and both are equally effective. With the rush protocol, the advantage is that less time is spent here for injections overall AND you would also reach maintenance dosing faster (which is when the clinical benefit starts to become more apparent). Not everyone is a candidate for rapid desensitization.   IF we proceed with the RUSH protocol, there are premedications which must be taken the day before and the day after the rush only (this includes antihistamines, steroids, and Singulair).  After the rush day, no prednisone or Singulair is required, and we just recommend antihistamines taken on your injection day.  What Is An Estimate of the Costs?  If you are interested in starting allergy  injections, please check with your insurance company about your coverage for both allergy  vial sets and allergy  injections.  Please do so prior to making the appointment to start injections.  The following are CPT codes to give to your insurance company. These are the amounts we BILL to the insurance company, but the amount YOU WILL PAY and WE RECEIVE IS SUBSTANTIALLY LESS and depends on the contracts we have with different insurance companies.   Amount Billed to Insurance One allergy  vial set  CPT 95165   $ 1200     Two allergy  vial set  CPT 95165   $ 2400     Three allergy  vial set  CPT 95165   $ 3600     One injection   CPT 95115   $ 35  Two injections   CPT 95117   $ 40 RUSH (Rapid Desensitization) CPT 95180 x 8 hours $500/hour  Regarding the allergy  injections, your co-pay may or may not apply with each injection, so please confirm this with your insurance company. When you start allergy  injections, 1 or 2 sets  of vials are made based on your allergies.  Not all patients can be on one set of vials. A set of vials lasts 6 months to a year depending on how quickly you can proceed with your build-up of your allergy  injections. Vials are personalized for each patient depending on their specific allergens.  How often are allergy  injection given during the build-up period?   Injections are given at least weekly during the build-up period until your maintenance dose is achieved. Per the doctor's discretion, you may have the option of getting allergy  injections two times per week during the build-up period. However, there must be at least 48 hours between injections. The build-up period is usually completed within 6-12 months depending on your ability  to schedule injections and for adjustments for reactions. When maintenance dose is reached, your injection schedule is gradually changed to every two weeks and later to every three weeks. Injections will then continue every 4 weeks. Usually, injections are continued for a total of 3-5 years.   When Will I Feel Better?  Some may experience decreased allergy  symptoms during the build-up phase. For others, it may take as long as 12 months on the maintenance dose. If there is no improvement after a year of maintenance, your allergist will discuss other treatment options with you.  If you arent responding to allergy  shots, it may be because there is not enough dose of the allergen in your vaccine or there are missing allergens that were not identified during your allergy  testing. Other reasons could be that there are high levels of the allergen in your environment or major exposure to non-allergic triggers like tobacco smoke.  What Is the Length of Treatment?  Once the maintenance dose is reached, allergy  shots are generally continued for three to five years. The decision to stop should be discussed with your allergist at that time. Some people may experience a permanent  reduction of allergy  symptoms. Others may relapse and a longer course of allergy  shots can be considered.  What Are the Possible Reactions?  The two types of adverse reactions that can occur with allergy  shots are local and systemic. Common local reactions include very mild redness and swelling at the injection site, which can happen immediately or several hours after. Report a delayed reaction from your last injection. These include arm swelling or runny nose, watery eyes or cough that occurs within 12-24 hours after injection. A systemic reaction, which is less common, affects the entire body or a particular body system. They are usually mild and typically respond quickly to medications. Signs include increased allergy  symptoms such as sneezing, a stuffy nose or hives.   Rarely, a serious systemic reaction called anaphylaxis can develop. Symptoms include swelling in the throat, wheezing, a feeling of tightness in the chest, nausea or dizziness. Most serious systemic reactions develop within 30 minutes of allergy  shots. This is why it is strongly recommended you wait in your doctors office for 30 minutes after your injections. Your allergist is trained to watch for reactions, and his or her staff is trained and equipped with the proper medications to identify and treat them.   Report to the nurse immediately if you experience any of the following symptoms: swelling, itching or redness of the skin, hives, watery eyes/nose, breathing difficulty, excessive sneezing, coughing, stomach pain, diarrhea, or light headedness. These symptoms may occur within 15-20 minutes after injection and may require medication.   Who Should Administer Allergy  Shots?  The preferred location for receiving shots is your prescribing allergists office. Injections can sometimes be given at another facility where the physician and staff are trained to recognize and treat reactions, and have received instructions by your  prescribing allergist.  What if I am late for an injection?   Injection dose will be adjusted depending upon how many days or weeks you are late for your injection.   What if I am sick?   Please report any illness to the nurse before receiving injections. She may adjust your dose or postpone injections depending on your symptoms. If you have fever, flu, sinus infection or chest congestion it is best to postpone allergy  injections until you are better. Never get an allergy  injection if your asthma is causing you  problems. If your symptoms persist, seek out medical care to get your health problem under control.  What If I am or Become Pregnant:  Women that become pregnant should schedule an appointment with [facility] before receiving any further allergy  injections.

## 2024-04-25 ENCOUNTER — Encounter: Payer: Self-pay | Admitting: Allergy & Immunology

## 2024-04-26 ENCOUNTER — Ambulatory Visit: Admitting: Nurse Practitioner

## 2024-05-17 ENCOUNTER — Ambulatory Visit: Admitting: Nurse Practitioner

## 2024-07-22 ENCOUNTER — Ambulatory Visit: Admitting: Allergy & Immunology
